# Patient Record
Sex: Male | Born: 1985 | Hispanic: Yes | Marital: Married | Smoking: Former smoker
Health system: Southern US, Community
[De-identification: ages and names within clinical notes are randomized; demographics above are authoritative.]

## PROBLEM LIST (undated history)

## (undated) DIAGNOSIS — J45909 Unspecified asthma, uncomplicated: Secondary | ICD-10-CM

## (undated) DIAGNOSIS — F419 Anxiety disorder, unspecified: Secondary | ICD-10-CM

## (undated) DIAGNOSIS — F1020 Alcohol dependence, uncomplicated: Secondary | ICD-10-CM

## (undated) DIAGNOSIS — I219 Acute myocardial infarction, unspecified: Secondary | ICD-10-CM

## (undated) HISTORY — DX: Anxiety disorder, unspecified: F41.9

## (undated) HISTORY — DX: Acute myocardial infarction, unspecified: I21.9

---

## 2003-06-07 ENCOUNTER — Emergency Department (HOSPITAL_COMMUNITY): Admission: EM | Admit: 2003-06-07 | Discharge: 2003-06-08 | Payer: Self-pay | Admitting: Emergency Medicine

## 2004-09-09 ENCOUNTER — Emergency Department (HOSPITAL_COMMUNITY): Admission: EM | Admit: 2004-09-09 | Discharge: 2004-09-09 | Payer: Self-pay | Admitting: Emergency Medicine

## 2004-12-13 ENCOUNTER — Emergency Department (HOSPITAL_COMMUNITY): Admission: EM | Admit: 2004-12-13 | Discharge: 2004-12-14 | Payer: Self-pay | Admitting: Emergency Medicine

## 2005-02-04 ENCOUNTER — Emergency Department (HOSPITAL_COMMUNITY): Admission: EM | Admit: 2005-02-04 | Discharge: 2005-02-05 | Payer: Self-pay | Admitting: Emergency Medicine

## 2008-11-24 ENCOUNTER — Emergency Department (HOSPITAL_COMMUNITY): Admission: EM | Admit: 2008-11-24 | Discharge: 2008-11-24 | Payer: Self-pay | Admitting: Emergency Medicine

## 2008-12-01 ENCOUNTER — Emergency Department (HOSPITAL_COMMUNITY): Admission: EM | Admit: 2008-12-01 | Discharge: 2008-12-01 | Payer: Self-pay | Admitting: Emergency Medicine

## 2010-04-23 ENCOUNTER — Emergency Department (HOSPITAL_COMMUNITY)
Admission: EM | Admit: 2010-04-23 | Discharge: 2010-04-23 | Disposition: A | Discharge status: Home / Self Care | Payer: Self-pay | Attending: Emergency Medicine | Admitting: Emergency Medicine

## 2010-04-23 DIAGNOSIS — J020 Streptococcal pharyngitis: Secondary | ICD-10-CM | POA: Insufficient documentation

## 2010-04-23 DIAGNOSIS — R509 Fever, unspecified: Secondary | ICD-10-CM | POA: Insufficient documentation

## 2010-04-23 DIAGNOSIS — J45909 Unspecified asthma, uncomplicated: Secondary | ICD-10-CM | POA: Insufficient documentation

## 2011-03-25 ENCOUNTER — Encounter (HOSPITAL_COMMUNITY): Payer: Self-pay | Admitting: *Deleted

## 2011-03-25 ENCOUNTER — Emergency Department (HOSPITAL_COMMUNITY)
Admission: EM | Admit: 2011-03-25 | Discharge: 2011-03-25 | Disposition: A | Discharge status: Home Health | Payer: Self-pay | Attending: Emergency Medicine | Admitting: Emergency Medicine

## 2011-03-25 ENCOUNTER — Emergency Department (HOSPITAL_COMMUNITY): Payer: Self-pay

## 2011-03-25 ENCOUNTER — Other Ambulatory Visit: Payer: Self-pay

## 2011-03-25 DIAGNOSIS — J40 Bronchitis, not specified as acute or chronic: Secondary | ICD-10-CM | POA: Insufficient documentation

## 2011-03-25 DIAGNOSIS — F410 Panic disorder [episodic paroxysmal anxiety] without agoraphobia: Secondary | ICD-10-CM | POA: Insufficient documentation

## 2011-03-25 DIAGNOSIS — F1010 Alcohol abuse, uncomplicated: Secondary | ICD-10-CM | POA: Insufficient documentation

## 2011-03-25 HISTORY — DX: Alcohol dependence, uncomplicated: F10.20

## 2011-03-25 MED ORDER — BENZONATATE 100 MG PO CAPS: 100.0000 mg | ORAL_CAPSULE | Freq: Three times a day (TID) | ORAL | Status: AC

## 2011-03-25 MED ORDER — ONDANSETRON HCL 4 MG/2ML IJ SOLN
INTRAMUSCULAR | Status: AC
  Filled 2011-03-25: qty 2

## 2011-03-25 MED ORDER — AZITHROMYCIN 250 MG PO TABS: 250.0000 mg | ORAL_TABLET | Freq: Every day | ORAL | Status: AC

## 2011-03-25 NOTE — ED Notes (Signed)
Patient returned from X-ray 

## 2011-03-25 NOTE — Discharge Instructions (Signed)
Bronchitis Bronchitis is the body's way of reacting to injury and/or infection (inflammation) of the bronchi. Bronchi are the air tubes that extend from the windpipe into the lungs. If the inflammation becomes severe, it may cause shortness of breath. CAUSES  Inflammation may be caused by:  A virus.   Germs (bacteria).   Dust.   Allergens.   Pollutants and many other irritants.  The cells lining the bronchial tree are covered with tiny hairs (cilia). These constantly beat upward, away from the lungs, toward the mouth. This keeps the lungs free of pollutants. When these cells become too irritated and are unable to do their job, mucus begins to develop. This causes the characteristic cough of bronchitis. The cough clears the lungs when the cilia are unable to do their job. Without either of these protective mechanisms, the mucus would settle in the lungs. Then you would develop pneumonia. Smoking is a common cause of bronchitis and can contribute to pneumonia. Stopping this habit is the single most important thing you can do to help yourself. TREATMENT   Your caregiver may prescribe an antibiotic if the cough is caused by bacteria. Also, medicines that open up your airways make it easier to breathe. Your caregiver may also recommend or prescribe an expectorant. It will loosen the mucus to be coughed up. Only take over-the-counter or prescription medicines for pain, discomfort, or fever as directed by your caregiver.   Removing whatever causes the problem (smoking, for example) is critical to preventing the problem from getting worse.   Cough suppressants may be prescribed for relief of cough symptoms.   Inhaled medicines may be prescribed to help with symptoms now and to help prevent problems from returning.   For those with recurrent (chronic) bronchitis, there may be a need for steroid medicines.  SEEK IMMEDIATE MEDICAL CARE IF:   During treatment, you develop more pus-like mucus  (purulent sputum).   You have a fever.   Your baby is older than 3 months with a rectal temperature of 102 F (38.9 C) or higher.   Your baby is 72 months old or younger with a rectal temperature of 100.4 F (38 C) or higher.   You become progressively more ill.   You have increased difficulty breathing, wheezing, or shortness of breath.  It is necessary to seek immediate medical care if you are elderly or sick from any other disease. MAKE SURE YOU:   Understand these instructions.   Will watch your condition.   Will get help right away if you are not doing well or get worse.  Document Released: 01/16/2005 Document Revised: 09/28/2010 Document Reviewed: 11/26/2007 ExitCare Patient Information 2012 ExitCare, Deatra Canter and Panic Attacks Anxiety is your body's way of reacting to real danger or something you think is a danger. It may be fear or worry over a situation like losing your job. Sometimes the cause is not known. A panic attack is made up of physical signs like sweating, shaking, or chest pain. Anxiety and panic attacks may start suddenly. They may be strong. They may come at any time of day, even while sleeping. They may come at any time of life. Panic attacks are scary, but they do not harm you physically.  HOME CARE  Avoid any known causes of your anxiety.   Try to relax. Yoga may help. Tell yourself everything will be okay.   Exercise often.   Get expert advice and help (therapy) to stop anxiety or attacks from happening.   Avoid  caffeine, alcohol, and drugs.   Only take medicine as told by your doctor.  GET HELP RIGHT AWAY IF:  Your attacks seem different than normal attacks.   Your problems are getting worse or concern you.  MAKE SURE YOU:  Understand these instructions.   Will watch your condition.   Will get help right away if you are not doing well or get worse.  Document Released: 02/18/2010 Document Revised: 09/28/2010 Document Reviewed:  02/18/2010 [facility] Patient Information 2012 [place], [place].Marland Kitchen

## 2011-03-25 NOTE — ED Notes (Signed)
ZOX:[id number]<AV> Expected date:03/25/11<BR> Expected time: 3:54 AM<BR> Means of arrival:Ambulance<BR> Comments:<BR> etoh

## 2011-03-25 NOTE — ED Provider Notes (Addendum)
History     CSN: [id number]  Arrival date & time 03/25/11  4098   First MD Initiated Contact with Patient 03/25/11 0425      Chief Complaint  Patient presents with  . Alcohol Problem    (Consider location/radiation/quality/duration/timing/severity/associated sxs/prior treatment) HPI Comments: Patient presents with possible anxiety or panic attack. He states she's had a history of anxiety and panic attacks in the past and today he was seen at his computer and felt suddenly very anxious and had the feeling he was given a diet. He did have some shortness of breath associated with it. Denies any chest pain other than he has a little pain in the center of his chest from coughing. He has had a productive cough for about 2 weeks now. Denies any fevers or chills. He's had a little bit of posttussive emesis but no other vomiting or diarrhea. No abdominal pain. Denies any headache. No numbness or weakness in his extremities. No balance problems. He does admit to drinking alcohol today but denies any drug use  Patient is a 26 y.o. male presenting with alcohol problem. The history is provided by the patient.  Alcohol Problem Pertinent negatives include no chest pain, no abdominal pain, no headaches and no shortness of breath.    Past Medical History  Diagnosis Date  . Alcoholic     History reviewed. No pertinent past surgical history.  History reviewed. No pertinent family history.  History  Substance Use Topics  . Smoking status: Not on file  . Smokeless tobacco: Not on file  . Alcohol Use:      usually 4 bottles every weekend      Review of Systems  Constitutional: Negative for fever, chills, diaphoresis and fatigue.  HENT: Positive for congestion. Negative for rhinorrhea and sneezing.   Eyes: Negative.   Respiratory: Positive for cough. Negative for chest tightness and shortness of breath.   Cardiovascular: Negative for chest pain and leg swelling.  Gastrointestinal: Negative  for nausea, vomiting, abdominal pain, diarrhea and blood in stool.  Genitourinary: Negative for frequency, hematuria, flank pain and difficulty urinating.  Musculoskeletal: Negative for back pain and arthralgias.  Skin: Negative for rash.  Neurological: Negative for dizziness, speech difficulty, weakness, numbness and headaches.    Allergies  Review of patient's allergies indicates no known allergies.  Home Medications   Current Outpatient Rx  Name Route Sig Dispense Refill  . AZITHROMYCIN 250 MG PO TABS Oral Take 1 tablet (250 mg total) by mouth daily. Take first 2 tablets together, then 1 every day until finished. 6 tablet 0  . BENZONATATE 100 MG PO CAPS Oral Take 1 capsule (100 mg total) by mouth every 8 (eight) hours. 21 capsule 0    BP 125/77  Pulse 92  Temp(Src) 98.6 F (37 C) (Oral)  Resp 18  SpO2 98%  Physical Exam  Constitutional: He is oriented to person, place, and time. He appears well-developed and well-nourished.  HENT:  Head: Normocephalic and atraumatic.  Eyes: Pupils are equal, round, and reactive to light.  Neck: Normal range of motion. Neck supple.  Cardiovascular: Normal rate, regular rhythm and normal heart sounds.   Pulmonary/Chest: Effort normal and breath sounds normal. No respiratory distress. He has no wheezes. He has no rales. He exhibits no tenderness.  Abdominal: Soft. Bowel sounds are normal. There is no tenderness. There is no rebound and no guarding.  Musculoskeletal: Normal range of motion. He exhibits no edema.  Lymphadenopathy:    He has no  cervical adenopathy.  Neurological: He is alert and oriented to person, place, and time.  Skin: Skin is warm and dry. No rash noted.  Psychiatric: He has a normal mood and affect.    ED Course  Procedures (including critical care time)   Date: 03/25/2011  Rate: 87  Rhythm: normal sinus rhythm  QRS Axis: normal  Intervals: normal  ST/T Wave abnormalities: nonspecific ST/T changes  Conduction  Disutrbances:none  Narrative Interpretation:   Old EKG Reviewed: unchanged   Dg Chest 2 View  03/25/2011  *RADIOLOGY REPORT*  Clinical Data: Cough, shortness of breath.  CHEST - 2 VIEW  Comparison: None  Findings: Peribronchial thickening. Heart and mediastinal contours are within normal limits.  No focal opacities or effusions.  No acute bony abnormality.  IMPRESSION: Bronchitic changes.  Original Report Authenticated By: Cyndie Chime, M.D.      1. Bronchitis   2. Panic attack       MDM  Patient is asymptomatic now other than his cough. There is nothing to suggest acute coronary syndrome. Neuro deficits. Suspect that this was a panic attack.  Since cough has been going on for 2 weeks, will tx with abx, cough meds.  Pt interested in following up with someone for his anxiety attacks.  Will give list of mental health providers to call and make an appointment with.       Rolan Bucco, MD 03/25/11 7829  Rolan Bucco, MD 03/25/11 [phone]

## 2011-08-03 ENCOUNTER — Encounter (HOSPITAL_COMMUNITY): Payer: Self-pay | Admitting: Emergency Medicine

## 2011-08-03 ENCOUNTER — Emergency Department (HOSPITAL_COMMUNITY)
Admission: EM | Admit: 2011-08-03 | Discharge: 2011-08-03 | Disposition: A | Discharge status: Home / Self Care | Payer: Self-pay | Attending: Emergency Medicine | Admitting: Emergency Medicine

## 2011-08-03 ENCOUNTER — Emergency Department (HOSPITAL_COMMUNITY): Payer: Self-pay

## 2011-08-03 DIAGNOSIS — F411 Generalized anxiety disorder: Secondary | ICD-10-CM | POA: Insufficient documentation

## 2011-08-03 DIAGNOSIS — R0602 Shortness of breath: Secondary | ICD-10-CM | POA: Insufficient documentation

## 2011-08-03 DIAGNOSIS — Z87891 Personal history of nicotine dependence: Secondary | ICD-10-CM | POA: Insufficient documentation

## 2011-08-03 DIAGNOSIS — F419 Anxiety disorder, unspecified: Secondary | ICD-10-CM

## 2011-08-03 HISTORY — DX: Unspecified asthma, uncomplicated: J45.909

## 2011-08-03 LAB — CBC
HCT: 45.1 % (ref 39.0–52.0)
Hemoglobin: 15.4 g/dL (ref 13.0–17.0)
MCH: 31.4 pg (ref 26.0–34.0)
MCHC: 34.1 g/dL (ref 30.0–36.0)
MCV: 92 fL (ref 78.0–100.0)
Platelets: 252 10*3/uL (ref 150–400)
RBC: 4.9 MIL/uL (ref 4.22–5.81)

## 2011-08-03 LAB — POCT I-STAT, CHEM 8
BUN: 16 mg/dL (ref 6–23)
Calcium, Ion: 1.24 mmol/L — ABNORMAL HIGH (ref 1.12–1.23)
Chloride: 101 mEq/L (ref 96–112)
Glucose, Bld: 100 mg/dL — ABNORMAL HIGH (ref 70–99)
HCT: 48 % (ref 39.0–52.0)
Potassium: 4.5 mEq/L (ref 3.5–5.1)
TCO2: 26 mmol/L (ref 0–100)

## 2011-08-03 MED ORDER — LORAZEPAM 1 MG PO TABS
1.0000 mg | ORAL_TABLET | Freq: Once | ORAL | Status: DC
  Filled 2011-08-03: qty 1

## 2011-08-03 MED ORDER — ALPRAZOLAM 0.5 MG PO TABS: 0.5000 mg | ORAL_TABLET | Freq: Three times a day (TID) | ORAL | Status: AC | PRN

## 2011-08-03 NOTE — ED Notes (Signed)
MD at bedside. 

## 2011-08-03 NOTE — ED Provider Notes (Signed)
History     CSN: [id number]  Arrival date & time 08/03/11  0618   First MD Initiated Contact with Patient 08/03/11 [phone]      Chief Complaint  Patient presents with  . Anxiety  . Shortness of Breath    HPI Pt states for the last few weeks he has been having trouble with feeling short of breath at times.  He gets anxious associated with this.  No chest pain or leg swelling.  No wheezing or cough.  He has some nasal congestion but that is new. He will feel like his throat is closing up when this occurs sometimes too.  He has had some incidental headaches behind his eye.  This AM he had another episode and it was worse so he came to be evaluation. No recent trips or travel.  No history of PE or CAD.  No family history of CAD.    Pt denies depression.  Prior history of alcohol use but denies current use. Past Medical History  Diagnosis Date  . Alcoholic   . Asthma     History reviewed. No pertinent past surgical history.  Family History  Problem Relation Age of Onset  . Diabetes Other     History  Substance Use Topics  . Smoking status: Former Games developer  . Smokeless tobacco: Not on file  . Alcohol Use: No     former      Review of Systems  All other systems reviewed and are negative.    Allergies  Review of patient's allergies indicates no known allergies.  Home Medications  No current outpatient prescriptions on file.  BP 116/73  Pulse 60  Temp 97.7 F (36.5 C) (Oral)  Resp 20  SpO2 100%  Physical Exam  Nursing note and vitals reviewed. Constitutional: He appears well-developed and well-nourished. No distress.  HENT:  Head: Normocephalic and atraumatic.  Right Ear: External ear normal.  Left Ear: External ear normal.  Eyes: Conjunctivae are normal. Right eye exhibits no discharge. Left eye exhibits no discharge. No scleral icterus.  Neck: Neck supple. No tracheal deviation present. No thyromegaly present.  Cardiovascular: Normal rate, regular rhythm and  intact distal pulses.   Pulmonary/Chest: Effort normal and breath sounds normal. No stridor. No respiratory distress. He has no wheezes. He has no rales.  Abdominal: Soft. Bowel sounds are normal. He exhibits no distension. There is no tenderness. There is no rebound and no guarding.  Musculoskeletal: He exhibits no edema and no tenderness.  Lymphadenopathy:    He has no cervical adenopathy.  Neurological: He is alert. He has normal strength. No sensory deficit. Cranial nerve deficit:  no gross defecits noted. He exhibits normal muscle tone. He displays no seizure activity. Coordination normal.  Skin: Skin is warm and dry. No rash noted.  Psychiatric: He has a normal mood and affect. His behavior is normal. Judgment and thought content normal.    ED Course  Procedures (including critical care time)  Labs Reviewed  POCT I-STAT, CHEM 8 - Abnormal; Notable for the following:    Glucose, Bld 100 (*)     Calcium, Ion 1.24 (*)     All other components within normal limits  CBC   Dg Chest 2 View  08/03/2011  *RADIOLOGY REPORT*  Clinical Data: Mid chest pain, shortness of breath, cough and congestion  CHEST - 2 VIEW  Comparison: Chest x-ray of 03/25/2011  Findings: No active infiltrate or effusion is seen.  As noted previously there is some peribronchial  thickening which may indicate bronchitis.  Mediastinal contours are stable.  The heart is within normal limits in size.  No bony abnormality is seen.  IMPRESSION:   No active lung disease.  No change in peribronchial thickening which may indicate bronchitis.  Original Report Authenticated By: Juline Patch, M.D.      MDM  No evidence of PNA.  Low risk for PE.  Perc negative.    Suspect symptoms are related to anxiety.  Will rx short course of benzo.   Recc follow up with a PCP to establish long care treatment.        Celene Kras, MD 08/03/11 [phone]

## 2011-08-03 NOTE — ED Notes (Signed)
Pt states he has been feeling short of breath lately and has been also having issues with anxiety  Pt states he has pain behind his right eye and pt states when he gets too hot he feels cold and gets shaky  Pt states he also feels like sometimes his throat is closing up and it is hard for him to swallow food  Pt states also for the past couple days he has developed some cold sxs  Pt states this morning he just felt more short of breath than usual and it scared him

## 2011-08-03 NOTE — ED Notes (Signed)
Pt reports difficulty breathing and feelings of anxiety that have been ongoing for about 2 weeks.  States that onset is random and that at times, he will feel cold and shaky when his anxiety is really bad.  Pt does also report headaches and a feeling of pressure behind his eyes but does not currently report any pain. No distress is noted at this time.

## 2011-08-03 NOTE — ED Notes (Signed)
Pt resting with family appears to be in no acute distress. Denies any pain, nausea/vomiting. States breathing is "fine at the moment".

## 2011-11-19 ENCOUNTER — Encounter (HOSPITAL_COMMUNITY): Payer: Self-pay | Admitting: Emergency Medicine

## 2011-11-19 ENCOUNTER — Emergency Department (HOSPITAL_COMMUNITY)
Admission: EM | Admit: 2011-11-19 | Discharge: 2011-11-19 | Disposition: A | Discharge status: Home / Self Care | Payer: Self-pay | Attending: Emergency Medicine | Admitting: Emergency Medicine

## 2011-11-19 DIAGNOSIS — Z87891 Personal history of nicotine dependence: Secondary | ICD-10-CM | POA: Insufficient documentation

## 2011-11-19 DIAGNOSIS — J329 Chronic sinusitis, unspecified: Secondary | ICD-10-CM | POA: Insufficient documentation

## 2011-11-19 MED ORDER — AMOXICILLIN 500 MG PO CAPS: 500.0000 mg | ORAL_CAPSULE | Freq: Three times a day (TID) | ORAL | Status: DC

## 2011-11-19 MED ORDER — GUAIFENESIN ER 600 MG PO TB12: 1200.0000 mg | ORAL_TABLET | Freq: Two times a day (BID) | ORAL | Status: DC

## 2011-11-19 NOTE — ED Provider Notes (Signed)
History     CSN: [id number]  Arrival date & time 11/19/11  1478   First MD Initiated Contact with Patient 11/19/11 0441      Chief Complaint  Patient presents with  . Cough   HPI  Provided by the patient. Patient is a 26 year old male with no significant PMH who presents with complaints of persistent cough, nasal congestion and sore throat. Patient reports having waxing waning symptoms for the past one month. Over the past one to 2 days symptoms have become a worse with increased sore throat and sinus pressure. Patient denies having any fever, chills or sweats. Cough is productive of yellow and green phlegm. He denies any hemoptysis. Denies any nausea, vomiting or diarrhea. Patient used one dose of Alka-Seltzer cough and cold yesterday without any significant change in symptoms. He has not used any other treatments.    Past Medical History  Diagnosis Date  . Alcoholic   . Asthma     History reviewed. No pertinent past surgical history.  Family History  Problem Relation Age of Onset  . Diabetes Other     History  Substance Use Topics  . Smoking status: Former Games developer  . Smokeless tobacco: Not on file  . Alcohol Use: Yes     occasional      Review of Systems  Constitutional: Negative for fever and chills.  HENT: Positive for congestion, sore throat and sinus pressure.   Respiratory: Positive for cough. Negative for shortness of breath.   Cardiovascular: Negative for chest pain.  Gastrointestinal: Negative for nausea, vomiting and diarrhea.  Skin: Negative for rash.    Allergies  Review of patient's allergies indicates no known allergies.  Home Medications   Current Outpatient Rx  Name Route Sig Dispense Refill  . ASPIRIN EFFERVESCENT 325 MG PO TBEF Oral Take 325 mg by mouth once.      BP 133/79  Pulse 92  Temp 98.8 F (37.1 C) (Oral)  Resp 20  Ht 5\' 8"  (1.727 m)  Wt 185 lb (83.915 kg)  BMI 28.13 kg/m2  SpO2 98%  Physical Exam  Nursing note and  vitals reviewed. Constitutional: He is oriented to person, place, and time. He appears well-developed and well-nourished. No distress.  HENT:  Head: Normocephalic.  Right Ear: Tympanic membrane normal.  Left Ear: Tympanic membrane normal.  Nose: Right sinus exhibits frontal sinus tenderness. Left sinus exhibits frontal sinus tenderness.       Pharynx is erythematous without enlarged tonsils or exudate.   Eyes: Conjunctivae normal and EOM are normal. Pupils are equal, round, and reactive to light.  Neck: Normal range of motion. Neck supple.       No meningeal signs.  Cardiovascular: Normal rate and regular rhythm.   No murmur heard. Pulmonary/Chest: Effort normal and breath sounds normal. No respiratory distress. He has no wheezes. He has no rales.  Lymphadenopathy:    He has no cervical adenopathy.  Neurological: He is alert and oriented to person, place, and time.  Skin: Skin is warm. No rash noted.  Psychiatric: He has a normal mood and affect. His behavior is normal.    ED Course  Procedures      1. Sinusitis       MDM  4:50AM patient seen and evaluated. Patient with symptoms waxing and waning for the past month. Now having significant maxillary sinus pressure, yellow and green sputum with cough. Concern for sinusitis. Will give prescription of amoxicillin and Mucinex.  Angus Seller, [place] 11/19/11 [phone]

## 2011-11-19 NOTE — ED Provider Notes (Signed)
Medical screening examination/treatment/procedure(s) were performed by non-physician practitioner and as supervising physician I was immediately available for consultation/collaboration.   Shantel Wesely M Malaya Cagley, MD 11/19/11 0540 

## 2011-11-19 NOTE — ED Notes (Signed)
Pt c/o productive cough with green sputum intermittent x 1 month, also c/o sore throat, nasal congestion and facial pressure. Pt states the children in home have been sick as well. PWD, no distress noted at this time.

## 2011-11-19 NOTE — ED Notes (Addendum)
Pt c/o cough for over as month. Pt has felt warm for last few days.pt has had thick green sputum.VSS.

## 2012-03-19 ENCOUNTER — Encounter (HOSPITAL_COMMUNITY): Payer: Self-pay | Admitting: *Deleted

## 2012-03-19 ENCOUNTER — Emergency Department (HOSPITAL_COMMUNITY)
Admission: EM | Admit: 2012-03-19 | Discharge: 2012-03-20 | Disposition: A | Discharge status: Home / Self Care | Payer: Self-pay | Attending: Emergency Medicine | Admitting: Emergency Medicine

## 2012-03-19 DIAGNOSIS — R109 Unspecified abdominal pain: Secondary | ICD-10-CM | POA: Insufficient documentation

## 2012-03-19 DIAGNOSIS — R110 Nausea: Secondary | ICD-10-CM | POA: Insufficient documentation

## 2012-03-19 DIAGNOSIS — R630 Anorexia: Secondary | ICD-10-CM | POA: Insufficient documentation

## 2012-03-19 DIAGNOSIS — Z87891 Personal history of nicotine dependence: Secondary | ICD-10-CM | POA: Insufficient documentation

## 2012-03-19 DIAGNOSIS — R142 Eructation: Secondary | ICD-10-CM | POA: Insufficient documentation

## 2012-03-19 DIAGNOSIS — R141 Gas pain: Secondary | ICD-10-CM | POA: Insufficient documentation

## 2012-03-19 LAB — URINALYSIS, ROUTINE W REFLEX MICROSCOPIC
Bilirubin Urine: NEGATIVE
Glucose, UA: NEGATIVE mg/dL
Hgb urine dipstick: NEGATIVE
Ketones, ur: NEGATIVE mg/dL
Leukocytes, UA: NEGATIVE
Nitrite: NEGATIVE
Protein, ur: NEGATIVE mg/dL
Urobilinogen, UA: 1 mg/dL (ref 0.0–1.0)
pH: 7 (ref 5.0–8.0)

## 2012-03-19 MED ORDER — IBUPROFEN 800 MG PO TABS: 800.0000 mg | ORAL_TABLET | Freq: Once | ORAL | Status: DC

## 2012-03-19 MED ORDER — ONDANSETRON HCL 4 MG PO TABS: 8.0000 mg | ORAL_TABLET | Freq: Once | ORAL | Status: DC

## 2012-03-19 NOTE — ED Notes (Signed)
Pt reports rt flank pain x1 month that spontaneously resolved on it's own - pt now experiencing generalized abd cramping and some continued rt flank pain x2 weeks. Pt denies n/v/d or fever. Pt also denies any dysuria. Pt in no acute distress at present.

## 2012-03-19 NOTE — ED Provider Notes (Signed)
History    This chart was scribed for Iona Coach, a non-physician practitioner working with Toy Baker, MD by Lewanda Rife, ED Scribe. This patient was seen in room WTR7/WTR7 and the patient's care was started at 2340.     CSN: [id number]  Arrival date & time 03/19/12  2250   First MD Initiated Contact with Patient 03/19/12 2322      Chief Complaint  Patient presents with  . Abdominal Cramping    (Consider location/radiation/quality/duration/timing/severity/associated sxs/prior treatment) HPI Alexander Spears is a 27 y.o. male who presents to the Emergency Department complaining of waxing and waning diffuse and always changing locations, abdominal pain onset 1 month since changing diet to eating mostly salads everyday. Pt reports mild loss of appetite, feeling bloated, and nausea. Pt denies dysuria, constipation, emesis, hematochezia, and melena. Pt reports abdominal pain is aching at rest and aggravated with movement. Pt denies taking any medications at home to treat symptoms. Pt reports drinking alcohol occasionally. Pt states just changed his diet about a month ago, and now tries to eat more salads and vegetables. States stools are harder than usual. Last BM today.    Past Medical History  Diagnosis Date  . Alcoholic   . Asthma     History reviewed. No pertinent past surgical history.  Family History  Problem Relation Age of Onset  . Diabetes Other     History  Substance Use Topics  . Smoking status: Former Games developer  . Smokeless tobacco: Not on file  . Alcohol Use: Yes     Comment: occasional      Review of Systems  All other systems reviewed and are negative.   A complete 10 system review of systems was obtained and all systems are negative except as noted in the HPI and PMH.    Allergies  Review of patient's allergies indicates no known allergies.  Home Medications   Current Outpatient Rx  Name  Route  Sig  Dispense  Refill  .  amoxicillin (AMOXIL) 500 MG capsule   Oral   Take 1 capsule (500 mg total) by mouth 3 (three) times daily.   30 capsule   0   . aspirin-sod bicarb-citric acid (ALKA-SELTZER) 325 MG TBEF   Oral   Take 325 mg by mouth once.         Marland Kitchen guaiFENesin (MUCINEX) 600 MG 12 hr tablet   Oral   Take 2 tablets (1,200 mg total) by mouth 2 (two) times daily.   30 tablet   0     BP 133/66  Pulse 74  Temp(Src) 98.5 F (36.9 C) (Oral)  SpO2 97%  Physical Exam  Nursing note and vitals reviewed. Constitutional: He is oriented to person, place, and time. He appears well-developed and well-nourished. No distress.  HENT:  Head: Normocephalic and atraumatic.  Eyes: EOM are normal.  Neck: Neck supple. No tracheal deviation present.  Cardiovascular: Normal rate.   Pulmonary/Chest: Effort normal. No respiratory distress.  Abdominal: Soft. Bowel sounds are normal. There is tenderness in the right lower quadrant and left lower quadrant. There is no guarding and no CVA tenderness.  Musculoskeletal: Normal range of motion.  Neurological: He is alert and oriented to person, place, and time.  Skin: Skin is warm and dry.  Psychiatric: He has a normal mood and affect. His behavior is normal.    ED Course  Procedures (including critical care time) Medications - No data to display  Results for orders placed during the hospital  encounter of 03/19/12  URINALYSIS, ROUTINE W REFLEX MICROSCOPIC      Result Value Range   Color, Urine YELLOW  YELLOW   APPearance CLOUDY (*) CLEAR   Specific Gravity, Urine 1.030  1.005 - 1.030   pH 7.0  5.0 - 8.0   Glucose, UA NEGATIVE  NEGATIVE mg/dL   Hgb urine dipstick NEGATIVE  NEGATIVE   Bilirubin Urine NEGATIVE  NEGATIVE   Ketones, ur NEGATIVE  NEGATIVE mg/dL   Protein, ur NEGATIVE  NEGATIVE mg/dL   Urobilinogen, UA 1.0  0.0 - 1.0 mg/dL   Nitrite NEGATIVE  NEGATIVE   Leukocytes, UA NEGATIVE  NEGATIVE  CBC WITH DIFFERENTIAL      Result Value Range   WBC 10.1   4.0 - 10.5 K/uL   RBC 4.66  4.22 - 5.81 MIL/uL   Hemoglobin 14.5  13.0 - 17.0 g/dL   HCT 16.1  09.6 - 04.5 %   MCV 90.8  78.0 - 100.0 fL   MCH 31.1  26.0 - 34.0 pg   MCHC 34.3  30.0 - 36.0 g/dL   RDW 40.9  81.1 - 91.4 %   Platelets 259  150 - 400 K/uL   Neutrophils Relative 50  43 - 77 %   Lymphocytes Relative 35  12 - 46 %   Monocytes Relative 13 (*) 3 - 12 %   Eosinophils Relative 2  0 - 5 %   Basophils Relative 0  0 - 1 %   Neutro Abs 5.1  1.7 - 7.7 K/uL   Lymphs Abs 3.5  0.7 - 4.0 K/uL   Monocytes Absolute 1.3 (*) 0.1 - 1.0 K/uL   Eosinophils Absolute 0.2  0.0 - 0.7 K/uL   Basophils Absolute 0.0  0.0 - 0.1 K/uL   WBC Morphology ATYPICAL LYMPHOCYTES    COMPREHENSIVE METABOLIC PANEL      Result Value Range   Sodium 138  135 - 145 mEq/L   Potassium 3.9  3.5 - 5.1 mEq/L   Chloride 101  96 - 112 mEq/L   CO2 29  19 - 32 mEq/L   Glucose, Bld 88  70 - 99 mg/dL   BUN 18  6 - 23 mg/dL   Creatinine, Ser 7.82  0.50 - 1.35 mg/dL   Calcium 9.0  8.4 - 95.6 mg/dL   Total Protein 7.8  6.0 - 8.3 g/dL   Albumin 3.8  3.5 - 5.2 g/dL   AST 16  0 - 37 U/L   ALT 16  0 - 53 U/L   Alkaline Phosphatase 70  39 - 117 U/L   Total Bilirubin 0.2 (*) 0.3 - 1.2 mg/dL   GFR calc non Af Amer >90  >90 mL/min   GFR calc Af Amer >90  >90 mL/min  LIPASE, BLOOD      Result Value Range   Lipase 25  11 - 59 U/L   Dg Abd 2 Views  03/20/2012  *RADIOLOGY REPORT*  Clinical Data: Abdominal distension, pain  ABDOMEN - 2 VIEW  Comparison: None.  Findings: Lung bases are clear.  Nonobstructed bowel gas pattern. Unremarkable colonic stool burden.  No organomegaly.  No radiopaque calculi identified.  No free air or air-fluid levels.  Osseous structures are intact and unremarkable.  IMPRESSION: Negative abdominal radiograph.   Original Report Authenticated By: Malachy Moan, M.D.       1. Abdominal  pain, other specified site       MDM  Pt with diffuse abdominal pain that comes  and goes. No associated  symptoms. Pain for several weeks. Recently changed his diet, now eats more salads, some days, salad is the only thing he eats. He is non toxic appearing on exam. No guarding on abdominal exam. Abdomen soft. Doubt surgical abdomen. His labs are normal. X-ray reviewed by me, there is some stool in his colon. Will try stool softners, pt does states his bowels changed from soft, to small hard pebbles. Will follow up outpatient for further evaluation and treatment.   Filed Vitals:   03/19/12 2316 03/20/12 0144  BP: 133/66 97/78  Pulse: 74 62  Temp: 98.5 F (36.9 C) 98.3 F (36.8 C)  TempSrc: Oral Oral  Resp:  16  SpO2: 97% 98%     I personally performed the services described in this documentation, which was scribed in my presence. The recorded information has been reviewed and is accurate.   Lottie Mussel, PA 03/20/12 [phone]

## 2012-03-20 ENCOUNTER — Emergency Department (HOSPITAL_COMMUNITY): Payer: Self-pay

## 2012-03-20 LAB — COMPREHENSIVE METABOLIC PANEL
AST: 16 U/L (ref 0–37)
Albumin: 3.8 g/dL (ref 3.5–5.2)
Alkaline Phosphatase: 70 U/L (ref 39–117)
BUN: 18 mg/dL (ref 6–23)
CO2: 29 mEq/L (ref 19–32)
Chloride: 101 mEq/L (ref 96–112)
Creatinine, Ser: 1.04 mg/dL (ref 0.50–1.35)
GFR calc Af Amer: >90 mL/min (ref >90)
GFR calc non Af Amer: >90 mL/min (ref >90)
Glucose, Bld: 88 mg/dL (ref 70–99)
Potassium: 3.9 mEq/L (ref 3.5–5.1)
Sodium: 138 mEq/L (ref 135–145)
Total Bilirubin: 0.2 mg/dL — ABNORMAL LOW (ref 0.3–1.2)
Total Protein: 7.8 g/dL (ref 6.0–8.3)

## 2012-03-20 LAB — CBC WITH DIFFERENTIAL/PLATELET
Basophils Absolute: 0 10*3/uL (ref 0.0–0.1)
Basophils Relative: 0 % (ref 0–1)
Eosinophils Absolute: 0.2 10*3/uL (ref 0.0–0.7)
Eosinophils Relative: 2 % (ref 0–5)
HCT: 42.3 % (ref 39.0–52.0)
Hemoglobin: 14.5 g/dL (ref 13.0–17.0)
Lymphocytes Relative: 35 % (ref 12–46)
Lymphs Abs: 3.5 10*3/uL (ref 0.7–4.0)
MCH: 31.1 pg (ref 26.0–34.0)
MCHC: 34.3 g/dL (ref 30.0–36.0)
MCV: 90.8 fL (ref 78.0–100.0)
Monocytes Absolute: 1.3 10*3/uL — ABNORMAL HIGH (ref 0.1–1.0)
Monocytes Relative: 13 % — ABNORMAL HIGH (ref 3–12)
Neutro Abs: 5.1 10*3/uL (ref 1.7–7.7)
Neutrophils Relative %: 50 % (ref 43–77)
Platelets: 259 10*3/uL (ref 150–400)
RBC: 4.66 MIL/uL (ref 4.22–5.81)
RDW: 13.1 % (ref 11.5–15.5)
WBC: 10.1 10*3/uL (ref 4.0–10.5)

## 2012-03-20 LAB — LIPASE, BLOOD: Lipase: 25 U/L (ref 11–59)

## 2012-03-20 MED ORDER — POLYETHYLENE GLYCOL 3350 17 GM/SCOOP PO POWD: 17.0000 g | Freq: Every day | ORAL | Status: DC

## 2012-03-25 NOTE — ED Provider Notes (Signed)
Medical screening examination/treatment/procedure(s) were performed by non-physician practitioner and as supervising physician I was immediately available for consultation/collaboration.  Hurman Horn, MD 03/25/12 1900

## 2012-08-18 ENCOUNTER — Emergency Department (HOSPITAL_COMMUNITY)
Admission: EM | Admit: 2012-08-18 | Discharge: 2012-08-19 | Disposition: A | Discharge status: Home / Self Care | Payer: Self-pay | Attending: Emergency Medicine | Admitting: Emergency Medicine

## 2012-08-18 ENCOUNTER — Encounter (HOSPITAL_COMMUNITY): Payer: Self-pay | Admitting: *Deleted

## 2012-08-18 DIAGNOSIS — Z87891 Personal history of nicotine dependence: Secondary | ICD-10-CM | POA: Insufficient documentation

## 2012-08-18 DIAGNOSIS — IMO0002 Reserved for concepts with insufficient information to code with codable children: Secondary | ICD-10-CM | POA: Insufficient documentation

## 2012-08-18 DIAGNOSIS — S0990XA Unspecified injury of head, initial encounter: Secondary | ICD-10-CM | POA: Insufficient documentation

## 2012-08-18 DIAGNOSIS — J45901 Unspecified asthma with (acute) exacerbation: Secondary | ICD-10-CM | POA: Insufficient documentation

## 2012-08-18 DIAGNOSIS — J329 Chronic sinusitis, unspecified: Secondary | ICD-10-CM | POA: Insufficient documentation
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
## 2012-08-19 ENCOUNTER — Emergency Department (HOSPITAL_COMMUNITY): Payer: Self-pay
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
## 2012-08-19 NOTE — ED Notes (Signed)
Patient transported to CT 
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
## 2012-08-19 NOTE — ED Notes (Signed)
Family at bedside. 

## 2012-10-01 ENCOUNTER — Encounter (HOSPITAL_COMMUNITY): Payer: Self-pay

## 2012-10-01 ENCOUNTER — Emergency Department (HOSPITAL_COMMUNITY)
Admission: EM | Admit: 2012-10-01 | Discharge: 2012-10-01 | Disposition: A | Discharge status: Home / Self Care | Payer: Self-pay | Attending: Emergency Medicine | Admitting: Emergency Medicine

## 2012-10-01 ENCOUNTER — Emergency Department (HOSPITAL_COMMUNITY): Payer: Self-pay

## 2012-10-01 DIAGNOSIS — R1013 Epigastric pain: Secondary | ICD-10-CM | POA: Insufficient documentation

## 2012-10-01 DIAGNOSIS — J45909 Unspecified asthma, uncomplicated: Secondary | ICD-10-CM | POA: Insufficient documentation

## 2012-10-01 DIAGNOSIS — R109 Unspecified abdominal pain: Secondary | ICD-10-CM

## 2012-10-01 DIAGNOSIS — R1011 Right upper quadrant pain: Secondary | ICD-10-CM | POA: Insufficient documentation

## 2012-10-01 DIAGNOSIS — R071 Chest pain on breathing: Secondary | ICD-10-CM | POA: Insufficient documentation

## 2012-10-01 DIAGNOSIS — Z87891 Personal history of nicotine dependence: Secondary | ICD-10-CM | POA: Insufficient documentation

## 2012-10-01 DIAGNOSIS — R0789 Other chest pain: Secondary | ICD-10-CM

## 2012-10-01 LAB — CBC WITH DIFFERENTIAL/PLATELET
Basophils Relative: 0 % (ref 0–1)
Eosinophils Absolute: 0.1 10*3/uL (ref 0.0–0.7)
Hemoglobin: 14.6 g/dL (ref 13.0–17.0)
Lymphs Abs: 2.3 10*3/uL (ref 0.7–4.0)
MCH: 30.5 pg (ref 26.0–34.0)
Monocytes Relative: 11 % (ref 3–12)
Neutro Abs: 5.2 10*3/uL (ref 1.7–7.7)
Neutrophils Relative %: 61 % (ref 43–77)
Platelets: 274 10*3/uL (ref 150–400)
RBC: 4.79 MIL/uL (ref 4.22–5.81)

## 2012-10-01 LAB — URINALYSIS, ROUTINE W REFLEX MICROSCOPIC
Bilirubin Urine: NEGATIVE
Leukocytes, UA: NEGATIVE
Nitrite: NEGATIVE
Specific Gravity, Urine: 1.029 (ref 1.005–1.030)
Urobilinogen, UA: 1 mg/dL (ref 0.0–1.0)
pH: 7 (ref 5.0–8.0)

## 2012-10-01 LAB — LIPASE, BLOOD: Lipase: 16 U/L (ref 11–59)

## 2012-10-01 LAB — POCT I-STAT, CHEM 8
Chloride: 102 mEq/L (ref 96–112)
Creatinine, Ser: 1.1 mg/dL (ref 0.50–1.35)
HCT: 45 % (ref 39.0–52.0)
Hemoglobin: 15.3 g/dL (ref 13.0–17.0)
Potassium: 3.9 mEq/L (ref 3.5–5.1)
Sodium: 140 mEq/L (ref 135–145)

## 2012-10-01 LAB — HEPATIC FUNCTION PANEL
Albumin: 4.1 g/dL (ref 3.5–5.2)
Total Protein: 7.4 g/dL (ref 6.0–8.3)

## 2012-10-01 LAB — POCT I-STAT TROPONIN I

## 2012-10-01 MED ORDER — IBUPROFEN 600 MG PO TABS: 600.0000 mg | ORAL_TABLET | Freq: Four times a day (QID) | ORAL | Status: DC | PRN

## 2012-10-01 NOTE — ED Notes (Signed)
Pt c/o lt upper chest/shoulder/arm pain x2days and c/o rt upper rib pain "feels like something is out of place"; denies n/v/SOB or injury

## 2012-10-01 NOTE — ED Provider Notes (Signed)
Medical screening examination/treatment/procedure(s) were performed by non-physician practitioner and as supervising physician I was immediately available for consultation/collaboration. Devoria Albe, MD, Armando Gang   Ward Givens, MD 10/01/12 [phone]

## 2012-10-01 NOTE — ED Provider Notes (Signed)
CSN: [id number]     Arrival date & time 10/01/12  1518 History   First MD Initiated Contact with Patient 10/01/12 1745     Chief Complaint  Patient presents with  . Chest Pain   (Consider location/radiation/quality/duration/timing/severity/associated sxs/prior Treatment) HPI Patient is a 27 year old male who presents to emergency department with complaint of right upper cord and pain and left-sided chest pain. Patient states abdominal pain began several weeks ago. States it comes and goes. Patient denies associated nausea or vomiting. States normal bowel movements. Denies difficulty eating. Has not tried any medications for this. Patient also reports a left upper chest pain. States pain is sharp and constant. States began 2 days ago. No associated shortness of breath. No fever, chills no cough. No history of the same. Patient denies any injuries or lifting any heavy objects. Patient states his chest is tender and he has tenderness going down left arm. Denies numbness or weakness in extremities. Denies pain worsening with deep breathing. Past Medical History  Diagnosis Date  . Alcoholic     pt denies  . Asthma    History reviewed. No pertinent past surgical history. Family History  Problem Relation Age of Onset  . Diabetes Other    History  Substance Use Topics  . Smoking status: Former Games developer  . Smokeless tobacco: Not on file  . Alcohol Use: No     Comment: 89yr ago    Review of Systems  Constitutional: Negative for fever and chills.  HENT: Negative for neck pain and neck stiffness.   Respiratory: Negative for cough, chest tightness and shortness of breath.   Cardiovascular: Positive for chest pain. Negative for palpitations and leg swelling.  Gastrointestinal: Positive for abdominal pain. Negative for nausea, vomiting, diarrhea and abdominal distention.  Genitourinary: Negative for dysuria, urgency, frequency, hematuria and testicular pain.  Musculoskeletal: Negative for myalgias  and arthralgias.  Skin: Negative for rash.  Allergic/Immunologic: Negative for immunocompromised state.  Neurological: Negative for dizziness, weakness, light-headedness, numbness and headaches.    Allergies  Review of patient's allergies indicates no known allergies.  Home Medications  No current outpatient prescriptions on file. BP 112/70  Pulse 74  Temp(Src) 98.5 F (36.9 C) (Oral)  Resp 16  SpO2 98% Physical Exam  Nursing note and vitals reviewed. Constitutional: He is oriented to person, place, and time. He appears well-developed and well-nourished. No distress.  HENT:  Head: Normocephalic and atraumatic.  Eyes: Conjunctivae are normal. Pupils are equal, round, and reactive to light.  Neck: Neck supple.  Cardiovascular: Normal rate, regular rhythm and normal heart sounds.   Pulmonary/Chest: Effort normal. No respiratory distress. He has no wheezes. He has no rales. He exhibits tenderness.  Tender over left clavicle and left upper chest. No bruising or swelling noted. No crepitus.  Abdominal: Soft. Bowel sounds are normal. He exhibits no distension. There is no tenderness. There is no rebound and no guarding.  Right upper quadrant and epigastric tenderness.  Musculoskeletal: He exhibits no edema.  Neurological: He is alert and oriented to person, place, and time.  Skin: Skin is warm and dry.    ED Course  Procedures (including critical care time)  Results for orders placed during the hospital encounter of 10/01/12  URINALYSIS, ROUTINE W REFLEX MICROSCOPIC      Result Value Range   Color, Urine YELLOW  YELLOW   APPearance CLOUDY (*) CLEAR   Specific Gravity, Urine 1.029  1.005 - 1.030   pH 7.0  5.0 - 8.0  Glucose, UA NEGATIVE  NEGATIVE mg/dL   Hgb urine dipstick NEGATIVE  NEGATIVE   Bilirubin Urine NEGATIVE  NEGATIVE   Ketones, ur NEGATIVE  NEGATIVE mg/dL   Protein, ur NEGATIVE  NEGATIVE mg/dL   Urobilinogen, UA 1.0  0.0 - 1.0 mg/dL   Nitrite NEGATIVE  NEGATIVE    Leukocytes, UA NEGATIVE  NEGATIVE  CBC WITH DIFFERENTIAL      Result Value Range   WBC 8.6  4.0 - 10.5 K/uL   RBC 4.79  4.22 - 5.81 MIL/uL   Hemoglobin 14.6  13.0 - 17.0 g/dL   HCT 45.4  09.8 - 11.9 %   MCV 90.4  78.0 - 100.0 fL   MCH 30.5  26.0 - 34.0 pg   MCHC 33.7  30.0 - 36.0 g/dL   RDW 14.7  82.9 - 56.2 %   Platelets 274  150 - 400 K/uL   Neutrophils Relative % 61  43 - 77 %   Neutro Abs 5.2  1.7 - 7.7 K/uL   Lymphocytes Relative 27  12 - 46 %   Lymphs Abs 2.3  0.7 - 4.0 K/uL   Monocytes Relative 11  3 - 12 %   Monocytes Absolute 0.9  0.1 - 1.0 K/uL   Eosinophils Relative 2  0 - 5 %   Eosinophils Absolute 0.1  0.0 - 0.7 K/uL   Basophils Relative 0  0 - 1 %   Basophils Absolute 0.0  0.0 - 0.1 K/uL  HEPATIC FUNCTION PANEL      Result Value Range   Total Protein 7.4  6.0 - 8.3 g/dL   Albumin 4.1  3.5 - 5.2 g/dL   AST 20  0 - 37 U/L   ALT 11  0 - 53 U/L   Alkaline Phosphatase 62  39 - 117 U/L   Total Bilirubin 0.4  0.3 - 1.2 mg/dL   Bilirubin, Direct <1.3  0.0 - 0.3 mg/dL   Indirect Bilirubin NOT CALCULATED  0.3 - 0.9 mg/dL  LIPASE, BLOOD      Result Value Range   Lipase 16  11 - 59 U/L  POCT I-STAT, CHEM 8      Result Value Range   Sodium 140  135 - 145 mEq/L   Potassium 3.9  3.5 - 5.1 mEq/L   Chloride 102  96 - 112 mEq/L   BUN 12  6 - 23 mg/dL   Creatinine, Ser 0.86  0.50 - 1.35 mg/dL   Glucose, Bld 89  70 - 99 mg/dL   Calcium, Ion 5.78  1.12 - 1.23 mmol/L   TCO2 27  0 - 100 mmol/L   Hemoglobin 15.3  13.0 - 17.0 g/dL   HCT 46.9  62.9 - 52.8 %  POCT I-STAT TROPONIN I      Result Value Range   Troponin i, poc 0.00  0.00 - 0.08 ng/mL   Comment 3            Dg Chest 2 View  10/01/2012   *RADIOLOGY REPORT*  Clinical Data: Left chest pain  CHEST - 2 VIEW  Comparison: 08/03/2011  Findings: The lungs remain clear.  Negative for heart failure or pneumonia.  No effusion or mass lesion.  IMPRESSION: Negative   Original Report Authenticated By: Janeece Riggers, M.D.       Date: 10/01/2012  Rate: 69  Rhythm: normal sinus rhythm  QRS Axis: normal  Intervals: normal  ST/T Wave abnormalities: normal  Conduction Disutrbances: none  Narrative Interpretation:  Old EKG Reviewed: No old     MDM   1. Chest wall pain   2. Abdominal  pain, other specified site     Patient with left upper chest wall pain and with right upper quadrant pain. Patient is in no distress and nontoxic appearing EKG is normal. Oh labs including LFTs and lipase are normal. Is tenderness in right upper quadrant is minimal and has been there for several months. His chest pain is reproducible with palpation of her left upper chest. He is neurovascularly intact. I suspect his abdominal pain is either musculoskeletal or do to a gallbladder disease. However this time he has no signs of cholecystitis and will need further outpatient workup. Patient's chest pain is most likely musculoskeletal. His chest x-ray is negative, troponin is negative, EKG is normal. He's per can negative. He'll be started on anti-inflammatories for his pain. He'll followup with primary care Dr.  Ceasar Mons Vitals:   10/01/12 1535  BP: 112/70  Pulse: 74  Temp: 98.5 F (36.9 C)  TempSrc: Oral  Resp: 16  SpO2: 98%       Lottie Mussel, PA-C 10/01/12 1941

## 2012-10-23 ENCOUNTER — Encounter (HOSPITAL_COMMUNITY): Payer: Self-pay | Admitting: Emergency Medicine

## 2012-10-23 ENCOUNTER — Emergency Department (HOSPITAL_COMMUNITY)
Admission: EM | Admit: 2012-10-23 | Discharge: 2012-10-23 | Disposition: A | Discharge status: Home / Self Care | Payer: Self-pay | Attending: Emergency Medicine | Admitting: Emergency Medicine

## 2012-10-23 ENCOUNTER — Emergency Department (HOSPITAL_COMMUNITY): Payer: Self-pay

## 2012-10-23 ENCOUNTER — Other Ambulatory Visit: Payer: Self-pay

## 2012-10-23 DIAGNOSIS — J45909 Unspecified asthma, uncomplicated: Secondary | ICD-10-CM | POA: Insufficient documentation

## 2012-10-23 DIAGNOSIS — R071 Chest pain on breathing: Secondary | ICD-10-CM | POA: Insufficient documentation

## 2012-10-23 DIAGNOSIS — Z87891 Personal history of nicotine dependence: Secondary | ICD-10-CM | POA: Insufficient documentation

## 2012-10-23 DIAGNOSIS — R42 Dizziness and giddiness: Secondary | ICD-10-CM | POA: Insufficient documentation

## 2012-10-23 DIAGNOSIS — R0789 Other chest pain: Secondary | ICD-10-CM | POA: Diagnosis present

## 2012-10-23 LAB — COMPREHENSIVE METABOLIC PANEL WITH GFR
ALT: 14 U/L (ref 0–53)
AST: 15 U/L (ref 0–37)
Albumin: 4.3 g/dL (ref 3.5–5.2)
Alkaline Phosphatase: 76 U/L (ref 39–117)
BUN: 12 mg/dL (ref 6–23)
CO2: 26 meq/L (ref 19–32)
Calcium: 9.2 mg/dL (ref 8.4–10.5)
Chloride: 99 meq/L (ref 96–112)
Creatinine, Ser: 0.96 mg/dL (ref 0.50–1.35)
GFR calc Af Amer: >90 mL/min
GFR calc non Af Amer: >90 mL/min
Glucose, Bld: 95 mg/dL (ref 70–99)
Potassium: 3.6 meq/L (ref 3.5–5.1)
Sodium: 137 meq/L (ref 135–145)
Total Bilirubin: 0.4 mg/dL (ref 0.3–1.2)
Total Protein: 8.2 g/dL (ref 6.0–8.3)

## 2012-10-23 LAB — CBC WITH DIFFERENTIAL/PLATELET
Basophils Relative: 0 % (ref 0–1)
HCT: 43 % (ref 39.0–52.0)
Hemoglobin: 14.7 g/dL (ref 13.0–17.0)
Lymphocytes Relative: 33 % (ref 12–46)
Lymphs Abs: 3.5 10*3/uL (ref 0.7–4.0)
Monocytes Relative: 8 % (ref 3–12)
Neutro Abs: 6.2 10*3/uL (ref 1.7–7.7)
Neutrophils Relative %: 57 % (ref 43–77)
RBC: 4.8 MIL/uL (ref 4.22–5.81)
WBC: 10.8 10*3/uL — ABNORMAL HIGH (ref 4.0–10.5)

## 2012-10-23 LAB — TROPONIN I: Troponin I: <0.3 ng/mL

## 2012-10-23 MED ORDER — ACETAMINOPHEN 325 MG PO TABS
650.0000 mg | ORAL_TABLET | Freq: Once | ORAL | Status: AC
  Administered 2012-10-23: 650 mg via ORAL
  Filled 2012-10-23: qty 2

## 2012-10-23 MED ORDER — LORAZEPAM 0.5 MG PO TABS
0.5000 mg | ORAL_TABLET | Freq: Once | ORAL | Status: AC
  Administered 2012-10-23: 0.5 mg via ORAL
  Filled 2012-10-23: qty 1
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
## 2012-10-23 NOTE — Progress Notes (Signed)
Patient confirms he does not have a pcp.  [facility] provided patient with a list of pcps who accept self pay patients, list of dicounted pharmacies and website needymeds.org for medication assistance, information regarding Medicaid and Affordable Care Act for insurance, list of financial assistance in the community.  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  EDCM stressed importance in finding a pcp. Patient verbalized understanding and thankful for resources. No further needs at this time.

## 2012-10-23 NOTE — ED Notes (Signed)
Patient transported to X-ray 

## 2012-10-23 NOTE — ED Provider Notes (Signed)
CSN: [id number]     Arrival date & time 10/23/12  1737 History   First MD Initiated Contact with Patient 10/23/12 1738     Chief Complaint  Patient presents with  . Chest Pain   (Consider location/radiation/quality/duration/timing/severity/associated sxs/prior Treatment) Patient is a 27 y.o. male presenting with chest pain. The history is provided by the patient.  Chest Pain Pain location:  L chest Pain quality: tightness   Pain radiates to:  L arm Pain radiates to the back: no   Pain severity:  Moderate Onset quality:  Gradual Duration:  1 week Timing:  Intermittent Progression:  Unchanged Chronicity:  Recurrent Context: not breathing   Relieved by:  Nothing Worsened by:  Nothing tried Ineffective treatments:  None tried Associated symptoms: cough (mild) and dizziness   Associated symptoms: no abdominal pain, no fever, no headache, no nausea, no numbness, no shortness of breath and not vomiting     Past Medical History  Diagnosis Date  . Alcoholic     pt denies  . Asthma    No past surgical history on file. Family History  Problem Relation Age of Onset  . Diabetes Other    History  Substance Use Topics  . Smoking status: Former Games developer  . Smokeless tobacco: Not on file  . Alcohol Use: No     Comment: 59yr ago    Review of Systems  Constitutional: Negative for fever.  HENT: Negative for rhinorrhea, drooling and neck pain.   Eyes: Negative for pain.  Respiratory: Positive for cough (mild). Negative for shortness of breath.   Cardiovascular: Positive for chest pain. Negative for leg swelling.  Gastrointestinal: Negative for nausea, vomiting, abdominal pain and diarrhea.  Genitourinary: Negative for dysuria and hematuria.  Musculoskeletal: Negative for gait problem.  Skin: Negative for color change.  Neurological: Positive for dizziness. Negative for numbness and headaches.  Hematological: Negative for adenopathy.  Psychiatric/Behavioral: Negative for behavioral  problems.  All other systems reviewed and are negative.    Allergies  Review of patient's allergies indicates no known allergies.  Home Medications   Current Outpatient Rx  Name  Route  Sig  Dispense  Refill  . ibuprofen (ADVIL,MOTRIN) 600 MG tablet   Oral   Take 1 tablet (600 mg total) by mouth every 6 (six) hours as needed for pain.   30 tablet   0    There were no vitals taken for this visit. Physical Exam  Nursing note and vitals reviewed. Constitutional: He is oriented to person, place, and time. He appears well-developed and well-nourished.  HENT:  Head: Normocephalic and atraumatic.  Right Ear: External ear normal.  Left Ear: External ear normal.  Nose: Nose normal.  Mouth/Throat: Oropharynx is clear and moist. No oropharyngeal exudate.  Eyes: Conjunctivae and EOM are normal. Pupils are equal, round, and reactive to light.  Neck: Normal range of motion. Neck supple.  Cardiovascular: Normal rate, regular rhythm, normal heart sounds and intact distal pulses.  Exam reveals no gallop and no friction rub.   No murmur heard. Pulmonary/Chest: Effort normal and breath sounds normal. No respiratory distress. He has no wheezes. He exhibits tenderness (left upper chest w/ mild ttp).  Abdominal: Soft. Bowel sounds are normal. He exhibits no distension. There is no tenderness. There is no rebound and no guarding.  Musculoskeletal: Normal range of motion. He exhibits no edema and no tenderness.  Neurological: He is alert and oriented to person, place, and time.  Skin: Skin is warm and dry.  Psychiatric:  He has a normal mood and affect. His behavior is normal.    ED Course  Procedures (including critical care time) Labs Review Labs Reviewed  CBC WITH DIFFERENTIAL - Abnormal; Notable for the following:    WBC 10.8 (*)    All other components within normal limits  COMPREHENSIVE METABOLIC PANEL  TROPONIN I   Imaging Review Dg Chest 2 View  10/23/2012   CLINICAL DATA:  Chest  pain and shortness of Breath  EXAM: CHEST  2 VIEW  COMPARISON:  October 01, 2012  FINDINGS: Lungs are clear. Heart size and pulmonary vascularity are normal. No adenopathy. No pneumothorax. No bone lesions.  IMPRESSION: No abnormality noted.   Electronically Signed   By: Bretta Bang   On: 10/23/2012 18:10     Date: 10/23/2012  Rate: 78  Rhythm: normal sinus rhythm  QRS Axis: normal  Intervals: normal  ST/T Wave abnormalities: normal  Conduction Disutrbances:none  Narrative Interpretation: Early repolarization. No new ST or T wave changes consistent with ischemia.    Old EKG Reviewed: unchanged    MDM   1. Chest wall pain    5:52 PM 27 y.o. male who presents with left arm and left-sided chest pain which has been intermittent for approximately one week. The patient states that he occasionally feels presyncopal but he denies any nausea vomiting diarrhea or fevers. The patient is afebrile and vital signs are unremarkable here. He is Wells and perc negative. His chest pain is reproducible with palpation and he states that this is the same pain that he is having in his chest. Possible family history of heart disease but otherwise no other risk factors. Atypical for cardiac. Will get screening labs Tylenol for pain. Ativan 0.5 mg for assoc anxiety.   7:52 PM: Pt feeling better on exam. Low risk for MACE per HEART score. Will rec d/c home w/ suspicion for chest wall pain.  I have discussed the diagnosis/risks/treatment options with the patient and believe the pt to be eligible for discharge home to follow-up with pcp as needed. We also discussed returning to the ED immediately if new or worsening sx occur. We discussed the sx which are most concerning (e.g., worsening pain, sob) that necessitate immediate return. Any new prescriptions provided to the patient are listed below.  New Prescriptions   No medications on file     Junius Argyle, MD 10/24/12 1056

## 2013-01-09 ENCOUNTER — Encounter (HOSPITAL_COMMUNITY): Payer: Self-pay | Admitting: Emergency Medicine

## 2013-01-09 ENCOUNTER — Emergency Department (HOSPITAL_COMMUNITY)
Admission: EM | Admit: 2013-01-09 | Discharge: 2013-01-09 | Disposition: A | Discharge status: Home / Self Care | Payer: Self-pay | Attending: Emergency Medicine | Admitting: Emergency Medicine

## 2013-01-09 ENCOUNTER — Emergency Department (HOSPITAL_COMMUNITY): Payer: Self-pay

## 2013-01-09 DIAGNOSIS — M549 Dorsalgia, unspecified: Secondary | ICD-10-CM | POA: Insufficient documentation

## 2013-01-09 DIAGNOSIS — Z87891 Personal history of nicotine dependence: Secondary | ICD-10-CM | POA: Insufficient documentation

## 2013-01-09 DIAGNOSIS — J45909 Unspecified asthma, uncomplicated: Secondary | ICD-10-CM | POA: Insufficient documentation

## 2013-01-09 DIAGNOSIS — R0789 Other chest pain: Secondary | ICD-10-CM

## 2013-01-09 DIAGNOSIS — Z8659 Personal history of other mental and behavioral disorders: Secondary | ICD-10-CM | POA: Insufficient documentation

## 2013-01-09 DIAGNOSIS — R071 Chest pain on breathing: Secondary | ICD-10-CM | POA: Insufficient documentation

## 2013-01-09 LAB — CBC WITH DIFFERENTIAL/PLATELET
Basophils Absolute: 0 10*3/uL (ref 0.0–0.1)
Basophils Relative: 0 % (ref 0–1)
Eosinophils Absolute: 0.1 10*3/uL (ref 0.0–0.7)
Eosinophils Relative: 1 % (ref 0–5)
Lymphocytes Relative: 18 % (ref 12–46)
MCV: 93 fL (ref 78.0–100.0)
Platelets: 240 10*3/uL (ref 150–400)
RDW: 13.7 % (ref 11.5–15.5)
WBC: 10 10*3/uL (ref 4.0–10.5)

## 2013-01-09 LAB — POCT I-STAT TROPONIN I: Troponin i, poc: 0 ng/mL (ref 0.00–0.08)

## 2013-01-09 LAB — BASIC METABOLIC PANEL
CO2: 27 mEq/L (ref 19–32)
Calcium: 9.2 mg/dL (ref 8.4–10.5)
GFR calc Af Amer: >90 mL/min (ref >90)
GFR calc non Af Amer: >90 mL/min (ref >90)
Sodium: 137 mEq/L (ref 135–145)

## 2013-01-09 MED ORDER — KETOROLAC TROMETHAMINE 30 MG/ML IJ SOLN
30.0000 mg | Freq: Once | INTRAMUSCULAR | Status: AC
  Administered 2013-01-09: 30 mg via INTRAMUSCULAR
  Filled 2013-01-09: qty 1

## 2013-01-09 MED ORDER — NAPROXEN 500 MG PO TABS: 500.0000 mg | ORAL_TABLET | Freq: Two times a day (BID) | ORAL | Status: DC

## 2013-01-09 NOTE — ED Notes (Addendum)
Pt states he had back pain that started 2 days ago, which moved to his right chest this morning. Pt describes pain as sharp and constant. Pt states he has intermittent shortness of breath and weakness. Pt denies nausea, dizziness, diaphoresis.

## 2013-01-09 NOTE — ED Provider Notes (Signed)
CSN: [id number]     Arrival date & time 01/09/13  1735 History   First MD Initiated Contact with Patient 01/09/13 1802     Chief Complaint  Patient presents with  . Back Pain  . Chest Pain   (Consider location/radiation/quality/duration/timing/severity/associated sxs/prior Treatment) HPI Comments: Patient presents today with a complaint of chest pain and back pain.  He reports that he has been having constant pain of his right scapula for the past 2-3 days.  Pain is worse with movement.  He denies any acute injury or trauma.  He has not taken anything for pain prior to arrival.  He reports that this morning he began having pain of the right anterior chest.  Pain has been constant since that time.  Pain does not radiate.  He reports that this pain is also worse with movement.  He reports that the chest pain is similar to the pain that he has had in the past.  Patient denies any prior cardiac history.  Patient denies any cough, SOB, nausea, vomiting, diaphoresis, dizziness, or syncope associated with the pain.  He reports that he does have a history of anxiety.  No history of HTN, DM, or Hyperlipidemia.  Denies family history of cardiac disease.  Denies cocaine use.  He denies any prolonged travel or surgeries in the past 4 weeks.  Denies lower extremity edema.  Denies history of DVT or PE.  The history is provided by the patient.    Past Medical History  Diagnosis Date  . Alcoholic     pt denies  . Asthma    History reviewed. No pertinent past surgical history. Family History  Problem Relation Age of Onset  . Diabetes Other    History  Substance Use Topics  . Smoking status: Former Games developer  . Smokeless tobacco: Not on file  . Alcohol Use: Yes     Comment: social    Review of Systems  All other systems reviewed and are negative.    Allergies  Review of patient's allergies indicates no known allergies.  Home Medications  No current outpatient prescriptions on file. BP 131/72   Pulse 77  Temp(Src) 98.2 F (36.8 C) (Oral)  Resp 20  Ht 5\' 10"  (1.778 m)  Wt 185 lb (83.915 kg)  BMI 26.54 kg/m2  SpO2 98% Physical Exam  Nursing note and vitals reviewed. Constitutional: He appears well-developed and well-nourished. No distress.  HENT:  Head: Normocephalic and atraumatic.  Mouth/Throat: Oropharynx is clear and moist.  Neck: Normal range of motion. Neck supple.  Cardiovascular: Normal rate, regular rhythm and normal heart sounds.   Pulses:      Dorsalis pedis pulses are 2+ on the right side, and 2+ on the left side.  Pulmonary/Chest: Effort normal and breath sounds normal. No respiratory distress. He has no wheezes. He has no rales. He exhibits tenderness.  Abdominal: Soft. Normal appearance and bowel sounds are normal. He exhibits no distension and no mass. There is no tenderness. There is no rebound, no guarding and negative Murphy's sign.  Musculoskeletal: Normal range of motion.  No LE edema bilaterally  Neurological: He is alert.  Skin: Skin is warm and dry. He is not diaphoretic.  Psychiatric: He has a normal mood and affect.    ED Course  Procedures (including critical care time) Labs Review Labs Reviewed - No data to display Imaging Review Dg Chest 2 View  01/09/2013   CLINICAL DATA:  Chest pain and back pain.  EXAM: CHEST  2  VIEW  COMPARISON:  10/23/2012  FINDINGS: Cardiomediastinal silhouette is within normal limits. The lungs are free of focal consolidations and pleural effusions. No pulmonary edema. Visualized osseous structures have a normal appearance.  IMPRESSION: No active cardiopulmonary disease.   Electronically Signed   By: Rosalie Gums M.D.   On: 01/09/2013 18:47    EKG Interpretation    Date/Time:  Thursday January 09 2013 17:42:03 EST Ventricular Rate:  72 PR Interval:  125 QRS Duration: 109 QT Interval:  340 QTC Calculation: 372 R Axis:   28 Text Interpretation:  Normal sinus rhythm Baseline wander in lead(s) V2 Nonspecific  intraventricular conduction delay No significant change since Confirmed by GOLDSTON  MD, SCOTT ([id number]) on 01/09/2013 5:45:31 PM           8:49 PM Reassessed patient.  He reports that symptoms have improved after given Toradol. MDM  No diagnosis found. Patient is a 27 year old male presenting today with chest pain.  He has several past ED visits for the same.  No prior cardiac history.  Heart score is zero.  Chest pain has been constant since this morning.  Troponin negative.  EKG unremarkable.  CXR negative.  Patient is PERC negative.  Chest wall tender to palpation.  Pain also worse with movement.  Pain improved after given Toradol.  Suspect that the pain is musculoskeletal.  Feel that the patient is stable for discharge.  Return precautions given.    Santiago Glad, PA-C 01/10/13 1002

## 2013-01-11 NOTE — ED Provider Notes (Signed)
Medical screening examination/treatment/procedure(s) were performed by non-physician practitioner and as supervising physician I was immediately available for consultation/collaboration.  EKG Interpretation    Date/Time:  Thursday January 09 2013 17:42:03 EST Ventricular Rate:  72 PR Interval:  125 QRS Duration: 109 QT Interval:  340 QTC Calculation: 372 R Axis:   28 Text Interpretation:  Normal sinus rhythm Baseline wander in lead(s) V2 Nonspecific intraventricular conduction delay No significant change since Confirmed by Mackena Plummer  MD, Konnor Jorden ([id number]) on 01/09/2013 5:45:31 PM              Audree Camel, MD 01/11/13 [phone]

## 2013-01-30 ENCOUNTER — Emergency Department (HOSPITAL_COMMUNITY)
Admission: EM | Admit: 2013-01-30 | Discharge: 2013-01-30 | Disposition: A | Discharge status: Home / Self Care | Payer: Self-pay | Attending: Emergency Medicine | Admitting: Emergency Medicine

## 2013-01-30 ENCOUNTER — Encounter (HOSPITAL_COMMUNITY): Payer: Self-pay | Admitting: Emergency Medicine

## 2013-01-30 ENCOUNTER — Emergency Department (HOSPITAL_COMMUNITY): Payer: Self-pay

## 2013-01-30 DIAGNOSIS — J40 Bronchitis, not specified as acute or chronic: Secondary | ICD-10-CM

## 2013-01-30 DIAGNOSIS — Z791 Long term (current) use of non-steroidal anti-inflammatories (NSAID): Secondary | ICD-10-CM | POA: Insufficient documentation

## 2013-01-30 DIAGNOSIS — J45901 Unspecified asthma with (acute) exacerbation: Secondary | ICD-10-CM | POA: Insufficient documentation

## 2013-01-30 DIAGNOSIS — Z79899 Other long term (current) drug therapy: Secondary | ICD-10-CM | POA: Insufficient documentation

## 2013-01-30 DIAGNOSIS — J029 Acute pharyngitis, unspecified: Secondary | ICD-10-CM | POA: Insufficient documentation

## 2013-01-30 DIAGNOSIS — Z87891 Personal history of nicotine dependence: Secondary | ICD-10-CM | POA: Insufficient documentation

## 2013-01-30 LAB — CBC
HEMATOCRIT: 43.5 % (ref 39.0–52.0)
HEMOGLOBIN: 14.8 g/dL (ref 13.0–17.0)
MCH: 31.8 pg (ref 26.0–34.0)
MCHC: 34 g/dL (ref 30.0–36.0)
MCV: 93.5 fL (ref 78.0–100.0)
PLATELETS: 250 10*3/uL (ref 150–400)
RBC: 4.65 MIL/uL (ref 4.22–5.81)
RDW: 13.7 % (ref 11.5–15.5)
WBC: 8.8 10*3/uL (ref 4.0–10.5)

## 2013-01-30 LAB — BASIC METABOLIC PANEL
BUN: 10 mg/dL (ref 6–23)
CHLORIDE: 104 meq/L (ref 96–112)
CO2: 25 meq/L (ref 19–32)
Calcium: 9.1 mg/dL (ref 8.4–10.5)
Creatinine, Ser: 0.87 mg/dL (ref 0.50–1.35)
GFR calc Af Amer: >90 mL/min (ref >90)
Glucose, Bld: 100 mg/dL — ABNORMAL HIGH (ref 70–99)
POTASSIUM: 3.9 meq/L (ref 3.7–5.3)
SODIUM: 142 meq/L (ref 137–147)

## 2013-01-30 MED ORDER — DM-GUAIFENESIN ER 30-600 MG PO TB12: 1.0000 | ORAL_TABLET | Freq: Two times a day (BID) | ORAL | Status: DC

## 2013-01-30 MED ORDER — ALBUTEROL SULFATE HFA 108 (90 BASE) MCG/ACT IN AERS: 1.0000 | INHALATION_SPRAY | Freq: Four times a day (QID) | RESPIRATORY_TRACT | Status: DC | PRN

## 2013-01-30 NOTE — ED Notes (Signed)
Per EMS pt stated he has had a cough for 2 weeks.  When pt woke up this am he felt SOB.  Pt tried OTC cough medicine w/ relief of sx however pt only took this once.

## 2013-01-30 NOTE — ED Provider Notes (Addendum)
CSN: [id number]     Arrival date & time 01/30/13  0601 History   First MD Initiated Contact with Patient 01/30/13 [phone]     Chief Complaint  Patient presents with  . Cough   (Consider location/radiation/quality/duration/timing/severity/associated sxs/prior Treatment) Patient is a 28 y.o. male presenting with cough. The history is provided by the patient.  Cough Associated symptoms: shortness of breath and sore throat   Associated symptoms: no chest pain, no fever, no headaches, no rash and no wheezing    patient with 2 week history of cough and congestion started out with a sore throat now resolved. Cough has persisted. Patient awoke this morning feeling very short of breath. Patient has been using Robitussin-DM cough medicine at home. Denies any fevers currently. History of asthma in the past.  Past Medical History  Diagnosis Date  . Alcoholic     pt denies  . Asthma     Has not had attack since childhood   History reviewed. No pertinent past surgical history. Family History  Problem Relation Age of Onset  . Diabetes Other    History  Substance Use Topics  . Smoking status: Former Games developermoker  . Smokeless tobacco: Not on file  . Alcohol Use: Yes     Comment: social    Review of Systems  Constitutional: Negative for fever.  HENT: Positive for congestion and sore throat.   Eyes: Negative for redness.  Respiratory: Positive for cough and shortness of breath. Negative for wheezing.   Cardiovascular: Negative for chest pain.  Gastrointestinal: Negative for nausea, vomiting and abdominal pain.  Genitourinary: Negative for dysuria.  Musculoskeletal: Negative for back pain.  Skin: Negative for rash.  Neurological: Negative for headaches.  Hematological: Does not bruise/bleed easily.  Psychiatric/Behavioral: Negative for confusion.    Allergies  Review of patient's allergies indicates no known allergies.  Home Medications   Current Outpatient Rx  Name  Route  Sig  Dispense   Refill  . naproxen (NAPROSYN) 500 MG tablet   Oral   Take 1 tablet (500 mg total) by mouth 2 (two) times daily.   30 tablet   0   . albuterol (PROVENTIL HFA;VENTOLIN HFA) 108 (90 BASE) MCG/ACT inhaler   Inhalation   Inhale 1-2 puffs into the lungs every 6 (six) hours as needed for wheezing or shortness of breath.   1 Inhaler   0   . dextromethorphan-guaiFENesin (MUCINEX DM) 30-600 MG per 12 hr tablet   Oral   Take 1 tablet by mouth 2 (two) times daily.   14 tablet   1    BP 127/70  Pulse 78  Temp(Src) 98.6 F (37 C) (Oral)  Resp 16  SpO2 96% Physical Exam  Nursing note and vitals reviewed. Constitutional: He is oriented to person, place, and time. He appears well-developed and well-nourished. No distress.  HENT:  Head: Normocephalic and atraumatic.  Mouth/Throat: Oropharynx is clear and moist. No oropharyngeal exudate.  Eyes: Conjunctivae are normal. Pupils are equal, round, and reactive to light.  Neck: Normal range of motion.  Cardiovascular: Normal rate, regular rhythm and normal heart sounds.   No murmur heard. Pulmonary/Chest: Effort normal and breath sounds normal. No respiratory distress. He has no wheezes. He has no rales.  Abdominal: Soft. Bowel sounds are normal. There is no tenderness.  Musculoskeletal: Normal range of motion. He exhibits no tenderness.  Neurological: He is alert and oriented to person, place, and time. No cranial nerve deficit. He exhibits normal muscle tone. Coordination normal.  Skin: Skin is warm. No rash noted.    ED Course  Procedures (including critical care time) Labs Review Labs Reviewed  BASIC METABOLIC PANEL - Abnormal; Notable for the following:    Glucose, Bld 100 (*)    All other components within normal limits  CBC   Results for orders placed during the hospital encounter of 01/30/13  BASIC METABOLIC PANEL      Result Value Range   Sodium 142  137 - 147 mEq/L   Potassium 3.9  3.7 - 5.3 mEq/L   Chloride 104  96 - 112  mEq/L   CO2 25  19 - 32 mEq/L   Glucose, Bld 100 (*) 70 - 99 mg/dL   BUN 10  6 - 23 mg/dL   Creatinine, Ser 5.78  0.50 - 1.35 mg/dL   Calcium 9.1  8.4 - 46.9 mg/dL   GFR calc non Af Amer >90  >90 mL/min   GFR calc Af Amer >90  >90 mL/min  CBC      Result Value Range   WBC 8.8  4.0 - 10.5 K/uL   RBC 4.65  4.22 - 5.81 MIL/uL   Hemoglobin 14.8  13.0 - 17.0 g/dL   HCT 62.9  52.8 - 41.3 %   MCV 93.5  78.0 - 100.0 fL   MCH 31.8  26.0 - 34.0 pg   MCHC 34.0  30.0 - 36.0 g/dL   RDW 24.4  01.0 - 27.2 %   Platelets 250  150 - 400 K/uL    Imaging Review Dg Chest 2 View  01/30/2013   CLINICAL DATA:  Cough  EXAM: CHEST  2 VIEW  COMPARISON:  Prior radiograph from 01/09/2013  FINDINGS: The cardiac and mediastinal silhouettes are stable in size and contour, and remain within normal limits.  The lungs are normally inflated. No airspace consolidation, pleural effusion, or pulmonary edema is identified. There is no pneumothorax.  No acute osseous abnormality identified.  IMPRESSION: No active cardiopulmonary disease.   Electronically Signed   By: Rise Mu M.D.   On: 01/30/2013 06:41    EKG Interpretation   None       MDM   1. Bronchitis    Chest x-ray negative for pneumonia. No leukocytosis. Most likely viral bronchitis following viral respiratory infection. Patient was treated with albuterol and Mucinex DM precautions to return for any newer worse symptoms.  Patient with history of asthma in the past currently no wheezing lungs are clear.    Shelda Jakes, MD 01/30/13 0730  Shelda Jakes, MD 01/30/13 0730

## 2013-01-30 NOTE — Discharge Instructions (Signed)
Bronchitis Bronchitis is a problem of the air tubes leading to your lungs. This problem makes it hard for air to get in and out of the lungs. You may cough a lot because your air tubes are narrow. Going without care can cause lasting (chronic) bronchitis. HOME CARE   Drink enough fluids to keep your pee (urine) clear or pale yellow.  Use a cool mist humidifier.  Quit smoking if you smoke. If you keep smoking, the bronchitis might not get better.  Only take medicine as told by your doctor. GET HELP RIGHT AWAY IF:   Coughing keeps you awake.  You start to wheeze.  You become more sick or weak.  You have a hard time breathing or get short of breath.  You cough up blood.  Coughing lasts more than 2 weeks.  You have a fever.  Your baby is older than 3 months with a rectal temperature of 102 F (38.9 C) or higher.  Your baby is 453 months old or younger with a rectal temperature of 100.4 F (38 C) or higher. MAKE SURE YOU:  Understand these instructions.  Will watch your condition.  Will get help right away if you are not doing well or get worse. Document Released: 07/05/2007 Document Revised: 04/10/2011 Document Reviewed: 09/10/2012 [facility] Patient Information 2014 [place], [place].  Take Mucinex DM as directed. Return for any new or worse symptoms. Also the albuterol inhaler will help suppress the cough can keep your lungs open. Work note provided.

## 2013-01-30 NOTE — ED Notes (Signed)
Bed: WA17 Expected date: 01/30/13 Expected time: 5:50 AM Means of arrival: Ambulance Comments: cough

## 2013-01-30 NOTE — ED Notes (Signed)
MD at bedside. 

## 2014-01-06 ENCOUNTER — Emergency Department (HOSPITAL_COMMUNITY)
Admission: EM | Admit: 2014-01-06 | Discharge: 2014-01-06 | Disposition: A | Discharge status: Home / Self Care | Payer: Self-pay | Attending: Emergency Medicine | Admitting: Emergency Medicine

## 2014-01-06 ENCOUNTER — Encounter (HOSPITAL_COMMUNITY): Payer: Self-pay | Admitting: Emergency Medicine

## 2014-01-06 ENCOUNTER — Emergency Department (HOSPITAL_COMMUNITY): Payer: Self-pay

## 2014-01-06 DIAGNOSIS — Z79899 Other long term (current) drug therapy: Secondary | ICD-10-CM | POA: Insufficient documentation

## 2014-01-06 DIAGNOSIS — J45909 Unspecified asthma, uncomplicated: Secondary | ICD-10-CM | POA: Insufficient documentation

## 2014-01-06 DIAGNOSIS — R630 Anorexia: Secondary | ICD-10-CM | POA: Insufficient documentation

## 2014-01-06 DIAGNOSIS — R110 Nausea: Secondary | ICD-10-CM | POA: Insufficient documentation

## 2014-01-06 DIAGNOSIS — Z87891 Personal history of nicotine dependence: Secondary | ICD-10-CM | POA: Insufficient documentation

## 2014-01-06 DIAGNOSIS — R079 Chest pain, unspecified: Secondary | ICD-10-CM

## 2014-01-06 DIAGNOSIS — R091 Pleurisy: Secondary | ICD-10-CM | POA: Insufficient documentation

## 2014-01-06 DIAGNOSIS — R1011 Right upper quadrant pain: Secondary | ICD-10-CM | POA: Insufficient documentation

## 2014-01-06 LAB — CBC WITH DIFFERENTIAL/PLATELET
BASOS PCT: 0 % (ref 0–1)
Basophils Absolute: 0 10*3/uL (ref 0.0–0.1)
Eosinophils Absolute: 0.2 10*3/uL (ref 0.0–0.7)
Eosinophils Relative: 2 % (ref 0–5)
HEMATOCRIT: 45.1 % (ref 39.0–52.0)
Hemoglobin: 15.2 g/dL (ref 13.0–17.0)
LYMPHS ABS: 2 10*3/uL (ref 0.7–4.0)
Lymphocytes Relative: 26 % (ref 12–46)
MCH: 31.6 pg (ref 26.0–34.0)
MCHC: 33.7 g/dL (ref 30.0–36.0)
MCV: 93.8 fL (ref 78.0–100.0)
MONOS PCT: 15 % — AB (ref 3–12)
Monocytes Absolute: 1.2 10*3/uL — ABNORMAL HIGH (ref 0.1–1.0)
NEUTROS ABS: 4.5 10*3/uL (ref 1.7–7.7)
Neutrophils Relative %: 57 % (ref 43–77)
Platelets: 251 10*3/uL (ref 150–400)
RBC: 4.81 MIL/uL (ref 4.22–5.81)
RDW: 13.3 % (ref 11.5–15.5)
WBC: 7.9 10*3/uL (ref 4.0–10.5)

## 2014-01-06 LAB — URINALYSIS, ROUTINE W REFLEX MICROSCOPIC
BILIRUBIN URINE: NEGATIVE
Glucose, UA: NEGATIVE mg/dL
Hgb urine dipstick: NEGATIVE
Ketones, ur: NEGATIVE mg/dL
Leukocytes, UA: NEGATIVE
NITRITE: NEGATIVE
PROTEIN: NEGATIVE mg/dL
Specific Gravity, Urine: 1.025 (ref 1.005–1.030)
UROBILINOGEN UA: 1 mg/dL (ref 0.0–1.0)
pH: 7.5 (ref 5.0–8.0)

## 2014-01-06 LAB — COMPREHENSIVE METABOLIC PANEL
ALBUMIN: 4.2 g/dL (ref 3.5–5.2)
ALK PHOS: 75 U/L (ref 39–117)
ALT: 21 U/L (ref 0–53)
ANION GAP: 11 (ref 5–15)
AST: 26 U/L (ref 0–37)
BUN: 13 mg/dL (ref 6–23)
CALCIUM: 9.6 mg/dL (ref 8.4–10.5)
CO2: 26 mEq/L (ref 19–32)
Chloride: 100 mEq/L (ref 96–112)
Creatinine, Ser: 0.95 mg/dL (ref 0.50–1.35)
GFR calc non Af Amer: >90 mL/min (ref >90)
Glucose, Bld: 95 mg/dL (ref 70–99)
POTASSIUM: 4.6 meq/L (ref 3.7–5.3)
Sodium: 137 mEq/L (ref 137–147)
TOTAL PROTEIN: 8.2 g/dL (ref 6.0–8.3)
Total Bilirubin: 0.5 mg/dL (ref 0.3–1.2)

## 2014-01-06 LAB — LIPASE, BLOOD: Lipase: 23 U/L (ref 11–59)

## 2014-01-06 NOTE — ED Notes (Signed)
Pt alert and oriented x4. Respirations even and unlabored, bilateral symmetrical rise and fall of chest. Skin warm and dry. In no acute distress. Denies needs.   

## 2014-01-06 NOTE — ED Provider Notes (Signed)
CSN: [id number]     Arrival date & time 01/06/14  1055 History   First MD Initiated Contact with Patient 01/06/14 1117     Chief Complaint  Patient presents with  . Nausea  . Rib cage pain      (Consider location/radiation/quality/duration/timing/severity/associated sxs/prior Treatment) HPI Implanted right-sided rib pain for 1 year waxes and wanes worse when he changes positions feels like "something gets stuck when I take a deep breath points to right lateral rib cage. Pain is worse when he changes positions improved when he remains still Patient also reports early satiety and nausea for 1 year. Questionable weight loss he has not weighed himself. No treatment prior to coming here. No other associated symptoms no fever Past Medical History  Diagnosis Date  . Alcoholic     pt denies  . Asthma     Has not had attack since childhood   History reviewed. No pertinent past surgical history. Family History  Problem Relation Age of Onset  . Diabetes Other    History  Substance Use Topics  . Smoking status: Former Games developermoker  . Smokeless tobacco: Not on file  . Alcohol Use: Yes     Comment: social   quit smoking several years ago. No alcohol for 2 months. No illicit drug use  Review of Systems  Constitutional: Positive for appetite change.  Cardiovascular: Positive for chest pain.       Right-sided rib cage pain  Gastrointestinal: Positive for nausea.      Allergies  Review of patient's allergies indicates no known allergies.  Home Medications   Prior to Admission medications   Medication Sig Start Date End Date Taking? Authorizing Provider  albuterol (PROVENTIL HFA;VENTOLIN HFA) 108 (90 BASE) MCG/ACT inhaler Inhale 1-2 puffs into the lungs every 6 (six) hours as needed for wheezing or shortness of breath. 01/30/13  Yes Vanetta MuldersScott Zackowski, MD  dextromethorphan-guaiFENesin [facility](MUCINEX DM) 30-600 MG per 12 hr tablet Take 1 tablet by mouth 2 (two) times daily. Patient not taking: Reported  on 01/06/2014 01/30/13   Vanetta MuldersScott Zackowski, MD  naproxen (NAPROSYN) 500 MG tablet Take 1 tablet (500 mg total) by mouth 2 (two) times daily. Patient not taking: Reported on 01/06/2014 01/09/13   Heather Laisure, PA-C   BP 123/48 mmHg  Pulse 62  Temp(Src) 98.3 F (36.8 C) (Oral)  Resp 18  SpO2 96% Physical Exam  Constitutional: He appears well-developed and well-nourished.  HENT:  Head: Normocephalic and atraumatic.  Eyes: Conjunctivae are normal. Pupils are equal, round, and reactive to light.  Neck: Neck supple. No tracheal deviation present. No thyromegaly present.  Cardiovascular: Normal rate and regular rhythm.   No murmur heard. Pulmonary/Chest: Effort normal and breath sounds normal.  Right anterior and posterior rib cage mildly tender.  Abdominal: Soft. Bowel sounds are normal. He exhibits no distension. There is no tenderness.  Mild tenderness right upper quadrant. Negative Murphy sign.  Musculoskeletal: Normal range of motion. He exhibits no edema or tenderness.  Neurological: He is alert. Coordination normal.  Skin: Skin is warm and dry. No rash noted.  Psychiatric: He has a normal mood and affect.  Nursing note and vitals reviewed.   ED Course  Procedures (including critical care time) Labs Review Labs Reviewed - No data to display  Imaging Review No results found.   EKG Interpretation None     declines pain medicine   1:30 PM patient resting comfortably. Chest x-ray reviewed by me Results for orders placed or performed during the hospital  encounter of 01/06/14  Urinalysis, Routine w reflex microscopic  Result Value Ref Range   Color, Urine YELLOW YELLOW   APPearance CLEAR CLEAR   Specific Gravity, Urine 1.025 1.005 - 1.030   pH 7.5 5.0 - 8.0   Glucose, UA NEGATIVE NEGATIVE mg/dL   Hgb urine dipstick NEGATIVE NEGATIVE   Bilirubin Urine NEGATIVE NEGATIVE   Ketones, ur NEGATIVE NEGATIVE mg/dL   Protein, ur NEGATIVE NEGATIVE mg/dL   Urobilinogen, UA 1.0 0.0 -  1.0 mg/dL   Nitrite NEGATIVE NEGATIVE   Leukocytes, UA NEGATIVE NEGATIVE  Comprehensive metabolic panel  Result Value Ref Range   Sodium 137 137 - 147 mEq/L   Potassium 4.6 3.7 - 5.3 mEq/L   Chloride 100 96 - 112 mEq/L   CO2 26 19 - 32 mEq/L   Glucose, Bld 95 70 - 99 mg/dL   BUN 13 6 - 23 mg/dL   Creatinine, Ser 1.610.95 0.50 - 1.35 mg/dL   Calcium 9.6 8.4 - 09.610.5 mg/dL   Total Protein 8.2 6.0 - 8.3 g/dL   Albumin 4.2 3.5 - 5.2 g/dL   AST 26 0 - 37 U/L   ALT 21 0 - 53 U/L   Alkaline Phosphatase 75 39 - 117 U/L   Total Bilirubin 0.5 0.3 - 1.2 mg/dL   GFR calc non Af Amer >90 >90 mL/min   GFR calc Af Amer >90 >90 mL/min   Anion gap 11 5 - 15  CBC with Differential  Result Value Ref Range   WBC 7.9 4.0 - 10.5 K/uL   RBC 4.81 4.22 - 5.81 MIL/uL   Hemoglobin 15.2 13.0 - 17.0 g/dL   HCT 04.545.1 40.939.0 - 81.152.0 %   MCV 93.8 78.0 - 100.0 fL   MCH 31.6 26.0 - 34.0 pg   MCHC 33.7 30.0 - 36.0 g/dL   RDW 91.413.3 78.211.5 - 95.615.5 %   Platelets 251 150 - 400 K/uL   Neutrophils Relative % 57 43 - 77 %   Neutro Abs 4.5 1.7 - 7.7 K/uL   Lymphocytes Relative 26 12 - 46 %   Lymphs Abs 2.0 0.7 - 4.0 K/uL   Monocytes Relative 15 (H) 3 - 12 %   Monocytes Absolute 1.2 (H) 0.1 - 1.0 K/uL   Eosinophils Relative 2 0 - 5 %   Eosinophils Absolute 0.2 0.0 - 0.7 K/uL   Basophils Relative 0 0 - 1 %   Basophils Absolute 0.0 0.0 - 0.1 K/uL  Lipase, blood  Result Value Ref Range   Lipase 23 11 - 59 U/L   Dg Chest 2 View  01/06/2014   CLINICAL DATA:  Chest pain  EXAM: CHEST  2 VIEW  COMPARISON:  01/30/2013  FINDINGS: The heart size and mediastinal contours are within normal limits. Both lungs are clear. The visualized skeletal structures are unremarkable.  IMPRESSION: No active cardiopulmonary disease.   Electronically Signed   By: Marlan Palauharles  Clark M.D.   On: 01/06/2014 13:05    MDM  Etiology of nausea and appetite changes unclear. Symptoms are suggestive of pleurisy. Plan Tylenol or Advil for pain. Referral to resource  guide Diagnosis #1 pleurisy #2 nausea etiology unclear Final diagnoses:  None        Doug SouSam Zelina Jimerson, MD 01/06/14 1338

## 2014-01-06 NOTE — ED Notes (Signed)
Pt escorted to discharge window. Pt verbalized understanding discharge instructions. In no acute distress.  

## 2014-01-06 NOTE — ED Notes (Signed)
Pt c/o right sided rib cage pain worsened with inspiration, c/o nausea after eating. Lung sounds present all fields. Pt denies trauma.

## 2014-01-06 NOTE — Discharge Instructions (Signed)
Pleurisy Take Tylenol or Advil for pain. Call any of the numbers on the resource guide to get a primary care physician to help determine the cause of your nausea and loss of appetite. Your lab work today and chest x-ray were normal Pleurisy is an inflammation and swelling of the lining of the lungs (pleura). Because of this inflammation, it hurts to breathe. It can be aggravated by coughing, laughing, or deep breathing.  HOME CARE INSTRUCTIONS  Monitor your pleurisy for any changes. The following actions may help to alleviate any discomfort you are experiencing:  Medicine may help with pain. Only take over-the-counter or prescription medicines for pain, discomfort, or fever as directed by your health care provider.  Only take antibiotic medicine as directed. Make sure to finish it even if you start to feel better. SEEK MEDICAL CARE IF:   Your pain is not controlled with medicine or is increasing.  You have an increase in pus-like (purulent) secretions brought up with coughing. SEEK IMMEDIATE MEDICAL CARE IF:   You have blue or dark lips, fingernails, or toenails.  You are coughing up blood.  You have increased difficulty breathing.  You have continuing pain unrelieved by medicine or pain lasting more than 1 week.  You have pain that radiates into your neck, arms, or jaw.  You develop increased shortness of breath or wheezing.  You develop a fever, rash, vomiting, fainting, or other serious symptoms. MAKE SURE YOU:  Understand these instructions.   Will watch your condition.   Will get help right away if you are not doing well or get worse.  Document Released: 01/16/2005 Document Revised: 09/18/2012 Document Reviewed: 06/30/2012 [facility] Patient Information 2015 [employer], [place]. This information is not intended to replace advice given to you by your health care provider. Make sure you discuss any questions you have with your health care provider.  Emergency Department  Resource Guide 1) Find a Doctor and Pay Out of Pocket Although you won't have to find out who is covered by your insurance plan, it is a good idea to ask around and get recommendations. You will then need to call the office and see if the doctor you have chosen will accept you as a new patient and what types of options they offer for patients who are self-pay. Some doctors offer discounts or will set up payment plans for their patients who do not have insurance, but you will need to ask so you aren't surprised when you get to your appointment.  2) Contact Your Local Health Department Not all health departments have doctors that can see patients for sick visits, but many do, so it is worth a call to see if yours does. If you don't know where your local health department is, you can check in your phone book. The CDC also has a tool to help you locate your state's health department, and many state websites also have listings of all of their local health departments.  3) Find a Walk-in Clinic If your illness is not likely to be very severe or complicated, you may want to try a walk in clinic. These are popping up all over the country in pharmacies, drugstores, and shopping centers. They're usually staffed by nurse practitioners or physician assistants that have been trained to treat common illnesses and complaints. They're usually fairly quick and inexpensive. However, if you have serious medical issues or chronic medical problems, these are probably not your best option.  No Primary Care Doctor: - Call Health Connect  at  [phone] - they can help you locate a primary care doctor that  accepts your insurance, provides certain services, etc. - Physician Referral Service- [phone]  Chronic Pain Problems: Organization         Address  Phone   Notes  [facility]  [phone]) [phone] Patients need to be referred by their primary care doctor.   Medication Assistance: Organization          Address  Phone   Notes  [facility] Medication [facility] [address] [place], [place] [address] [phone]) [phone] --Must be a resident of [facility] -- Must have NO insurance coverage whatsoever (no Medicaid/ Medicare, etc.) -- The pt. MUST have a primary care doctor that directs their care regularly and follows them in the community   MedAssist  [phone]   [employer]  [phone]) [phone]    Agencies that provide inexpensive medical care: Organization         Address  Phone   Notes  [facility]  [phone]   [facility]    [phone]) [phone]   [facility] [address] [place], [place] [address] [phone]) [phone]   [facility] [address], [place] [phone]   Planned Parenthood    [phone](336) [phone]   [facility]    [phone](336) [phone]   [facility] and [facility]  [address], [place] Phone:  [phone], Fax:  [phone] Hours of Operation:  9 am - 6 pm, M-F.  Also accepts Medicaid/Medicare and self-pay.  [facility]  [address], [place] Phone: [phone](336) [phone], Fax: [phone]. Hours of Operation:  8:30 am - 5:30 pm, M-F.  Also accepts Medicaid and self-pay.  [facility] [place] [address], [place] Phone: [phone]) [phone]   [facility] [address], [place], [place] [phone], Ext. [phone] Mondays & Thursdays: 7-9 AM.  First 15 patients are seen on a first come, first serve basis.    Medicaid-accepting [facility] Providers:  Organization         Address  Phone   Notes  [facility] [address], [place] [phone]) [phone] Also accepts self-pay patients.  [facility] [address], [place]  [phone]) [phone]   [facility] [address], [place] [phone]) [phone]   [facility] [address] [place] [address], [place] [phone]   Renaye RakersVeita Bland [address], [place]   [phone] Only accepts [place] Access [place] patients after they have their name applied to their card.   Self-Pay (no insurance) in [place] [facility]:  Organization         Address  Phone   Notes  Sickle Cell Patients, [facility] [address], [place] [phone]) [phone]   [facility] [address], [place] [phone]) [phone]   [facility] [place]  [address], [place] [phone]   [facility]/Dr. Osei-Bonsu  [address] [place] [address], [place] or [phone], [place] [phone] Phone number for both [place] and [place] locations is the same.  [facility] [address], [place] [phone]) [phone]   [facility] [place] [address] [place]  [address], [place] or [address] [phone]   [facility] [address] [place], [place] [phone], phone; [phone], fax Sees patients 1st and 3rd Saturday of every month.  Must not qualify for public or private insurance (i.e. Medicaid, Medicare, [place] Health Choice, Veterans' Benefits)  Household income should be no more than 200% of the poverty level The clinic cannot treat you if you are pregnant or think you are pregnant  Sexually transmitted diseases are not treated at the clinic.    Dental Care: Organization         Address  Phone  Notes  [facility] [address] [place], [place] [phone] Accepts children up to age 85 who are enrolled in [place] or [place] Health Choice; pregnant women with a Medicaid card; and children who have applied for Medicaid or [place] Health Choice, but were declined, whose parents can pay a reduced fee at time of service.  [facility]  [address], [place] [phone] Accepts children up to age 49 who are enrolled in [place] or [place] Health Choice; pregnant women with a Medicaid card; and children who have applied for Medicaid or [place] Health Choice, but were declined, whose parents can pay a reduced fee at time of service.  [facility]  [address] [place], [place] [phone] Patients are seen by appointment only. Walk-ins are not accepted. [facility] will see patients 50 years of age and older. Monday - Tuesday (8am-5pm) Most Wednesdays (8:30-5pm) $30 per visit, cash only  [facility] Adult Dental Access PROGRAM  [address], [facility] [phone] Patients are seen by appointment only. Walk-ins are not accepted. [facility] will see patients 16 years of age and older. One Wednesday Evening (Monthly: Volunteer Based).  $30 per visit, cash only  [employer]  [phone] for adults; Children under age 18, call Graduate Pediatric Dentistry at [phone]. Children aged 60-14, please call [phone] to request a pediatric application.  Dental services are provided in all areas of dental care including fillings, crowns and bridges, complete and partial dentures, implants, gum treatment, root canals, and extractions. Preventive care is also provided. Treatment is provided to both adults and children. Patients are selected via a lottery and there is often a waiting list.   [facility] [address] [place]  [phone] [email or web address]   [facility] [address] [place], [place] [phone], Ext. [phone] Second and Fourth Thursday of each month, opens at 6:30 AM; Clinic ends at 9 AM.  Patients are seen on a first-come first-served basis, and a limited number are seen during each clinic.   [facility]  [address] [place], [place] [phone]   Eligibility Requirements You must  have lived in [place], [place], or [place] counties for at least the last three months.   You cannot be eligible for state or federal sponsored [place], including [employer], [place], or [employer].   You generally cannot be eligible for healthcare insurance through your employer.    How to apply: Eligibility screenings are held every Tuesday and Wednesday afternoon from 1:00 pm until 4:00 pm. You do not need an appointment for the interview!  [facility] [address] [place], [place] [phone]   [facility] Department  [phone]  [place] Department  [place]  [phone]    Behavioral Health Resources in the Community: Intensive Outpatient Programs Organization         Address  Phone  Notes  [place]. [address] [place], [place] [phone]   [facility] [address], [place], [place]   ADS: Alcohol & Drug S[facility]cs [address], [place], [place]   [place] [address]  [place], [place] or [phone]   Substance Abuse Resources Organization         Address  Phone  Notes  Alcohol and Drug Services  [phone]   [place]  [phone]   The [place]   Chinita Pester  [phone]   Residential & Outpatient Substance Abuse Program  [phone]   Psychological Services Organization         Address  Phone  Notes  [facility] [place]  [place]  [phone]   [place] [address] [place] or [phone]    [place] Crisis Teams Organization         Address  Phone  Notes  [facility], [place] Crisis Care Unit  [phone]   [facility] Psychotherapeutic Services  [address] [place], [place]   [place] Levels [address] [place] [phone]    Self-Help/Support Groups Organization         Address  Phone             Notes  [place] of [place] - variety of support groups  [place] Call for more information  Narcotics Anonymous (NA), [facility] [address], [employer] [place]  2 meetings at this location   Special educational needs teacher         Address  Phone  Notes  ASAP Residential Treatment [place],    [place]  [phone]   [facility]  [address], [place] [address], [place], [place]   [facility], [place] [phone] Admissions: 8am-3pm M-F  Incentives Substance [place] [address]    [place], [place] [phone]   [facility] [address] [place], [place], [place]   [facility] [address]  [place], [place]   Insight Programs - Intensive Outpatient [address], [place], [place]   [facility] ([place].,  [place], [place] [phone] or [phone]   [facility] Services (RTS) [address], [place], [place] Accepts Medicaid  [facility] [place] [address],  [place] [phone] Substance Abuse/Addiction Treatment   [facility] Organization         Address  Phone  Notes  [facility]  [phone]   Domenic Schwab, PhD [address] Arlis Porta [place], [place]   [phone] or [phone]   Cameron Natrona [place] [place], [place] [phone]   [place] [address] [place], [place] [phone] Insurance/Medicaid/sponsorship through [employer] and Families [address]  [place], [place] [phone] [address] [place], [place] [phone]    Dr. Adele Schilder  [phone]   [facility] Dept. 1) [address] [place] 2) [address] [place] 3)  [place] [address], [place] [phone]  [phone]   [place] [phone] or [phone] (After Hours)

## 2014-04-01 ENCOUNTER — Ambulatory Visit (INDEPENDENT_AMBULATORY_CARE_PROVIDER_SITE_OTHER): Payer: Self-pay | Admitting: Physician Assistant

## 2014-04-01 ENCOUNTER — Other Ambulatory Visit: Payer: Self-pay | Admitting: Physician Assistant

## 2014-04-01 VITALS — BP 116/70 | HR 60 | Temp 97.9°F | Resp 16 | Ht 68.75 in | Wt 183.0 lb

## 2014-04-01 DIAGNOSIS — K219 Gastro-esophageal reflux disease without esophagitis: Secondary | ICD-10-CM | POA: Insufficient documentation

## 2014-04-01 DIAGNOSIS — R1011 Right upper quadrant pain: Secondary | ICD-10-CM

## 2014-04-01 DIAGNOSIS — Z8659 Personal history of other mental and behavioral disorders: Secondary | ICD-10-CM

## 2014-04-01 LAB — POCT CBC
Granulocyte percent: 67.9 %G (ref 37–80)
HCT, POC: 46.1 % (ref 43.5–53.7)
Hemoglobin: 15.1 g/dL (ref 14.1–18.1)
LYMPH, POC: 2.3 (ref 0.6–3.4)
MCH: 30.6 pg (ref 27–31.2)
MCHC: 32.9 g/dL (ref 31.8–35.4)
MCV: 93.1 fL (ref 80–97)
MID (CBC): 0.9 (ref 0–0.9)
MPV: 8.4 fL (ref 0–99.8)
PLATELET COUNT, POC: 230 10*3/uL (ref 142–424)
POC GRANULOCYTE: 6.8 (ref 2–6.9)
POC LYMPH PERCENT: 22.9 %L (ref 10–50)
POC MID %: 9.2 % (ref 0–12)
RBC: 4.95 M/uL (ref 4.69–6.13)
RDW, POC: 14.1 %
WBC: 10 10*3/uL (ref 4.6–10.2)

## 2014-04-01 LAB — POCT URINALYSIS DIPSTICK
Bilirubin, UA: NEGATIVE
Blood, UA: NEGATIVE
GLUCOSE UA: NEGATIVE
KETONES UA: NEGATIVE
Leukocytes, UA: NEGATIVE
Nitrite, UA: NEGATIVE
PROTEIN UA: NEGATIVE
Spec Grav, UA: 1.025
Urobilinogen, UA: 1
pH, UA: 6

## 2014-04-01 LAB — POCT UA - MICROSCOPIC ONLY
BACTERIA, U MICROSCOPIC: NEGATIVE
CASTS, UR, LPF, POC: NEGATIVE
Crystals, Ur, HPF, POC: NEGATIVE
Epithelial cells, urine per micros: NEGATIVE
Mucus, UA: NEGATIVE
RBC, URINE, MICROSCOPIC: NEGATIVE
WBC, UR, HPF, POC: NEGATIVE
Yeast, UA: NEGATIVE

## 2014-04-01 MED ORDER — OMEPRAZOLE 20 MG PO CPDR: 20.0000 mg | DELAYED_RELEASE_CAPSULE | Freq: Every day | ORAL | Status: AC

## 2014-04-01 NOTE — Addendum Note (Signed)
Addended by: Ofilia NeasLARK, Jaquarius Seder L on: 04/01/2014 09:28 PM   Modules accepted: Level of Service

## 2014-04-01 NOTE — Patient Instructions (Addendum)
Appointment for Abdominal Ultrasound [address]  Imaging  04/02/14 at 12:30 pm $288- if pay in full tomorrow the cost will be $115.20 Nothing to eat or drink 6 hours prior

## 2014-04-01 NOTE — Progress Notes (Signed)
04/01/2014 at 9:08 PM  Alexander Spears / DOB: 01-02-1986 / MRN: [id number]  The patient has Chest wall pain and GERD (gastroesophageal reflux disease) on his problem list.  SUBJECTIVE  Chief compalaint: Flank Pain; Back Pain; and Abdominal Pain   History of present illness: Alexander Spears is 29 y.o. well appearing male with a history of anxiety presenting for the evaluation of waxing and waning sharp and dull right upper quadrant pain which he states is moderate to severe. This started roughly 1 year ago and is stable. Associated symptoms include tenderness in the right upper quadrant, nausea after eating and often nausea without eating, sometimes  belching, bilateral lower back pain, as well as indigestion.  He denies fever, and is stooling and urnating normally for him. He has tried been to the ED for work up of this problem in the past and was told he had chest wall pain. He has never had an ultrasound of the right upper quadrant and would like to have this done, as he is convinced that something is wrong with his liver despite normal labs. His uncle passed from liver failure secondary to alcoholism.  Alexander Spears is a non drinker for the last three months, and previous testing shows normal liver studies.    He does complain of indigestion that occurs two to three times per week, but can not necessarily asoociate it with the above complaint.  He denies smoking, weight loss, melena, hematochezia, dysphagia, and odynophagia.  He symptoms are not worse with lying down at night and he can not associate his indigestion with certain foods.        He  has a past medical history of Alcoholic; Asthma; Anxiety; and Myocardial infarction.    He currently has no medications in their medication list.  Alexander Spears has No Known Allergies. He  reports that he has quit smoking. He does not have any smokeless tobacco history on file. He reports that he drinks alcohol. He reports that he does not use illicit  drugs. He  has no sexual activity history on file. The patient  has no past surgical history on file.  His family history includes Diabetes in his other.  ROS  Per HPI  OBJECTIVE  His  height is 5' 8.75" (1.746 m) and weight is 183 lb (83.008 kg). His oral temperature is 97.9 F (36.6 C). His blood pressure is 116/70 and his pulse is 60. His respiration is 16 and oxygen saturation is 99%.  The patient's body mass index is 27.23 kg/(m^2).  Physical Exam  Constitutional: He appears well-developed and well-nourished.  HENT:  Head: Normocephalic.  Eyes: Conjunctivae and EOM are normal. Pupils are equal, round, and reactive to light. No scleral icterus.  Cardiovascular: Normal rate, regular rhythm and normal heart sounds.  Exam reveals no gallop and no friction rub.   No murmur heard. Respiratory: Effort normal and breath sounds normal. He has no wheezes. He has no rales. He exhibits no tenderness.  GI: Soft. Bowel sounds are normal. He exhibits no distension and no mass. There is no hepatosplenomegaly. There is no tenderness. There is no rigidity, no rebound, no guarding, no CVA tenderness, no tenderness at McBurney's point and negative Murphy's sign. No hernia. Hernia confirmed negative in the ventral area.      Results for orders placed or performed in visit on 04/01/14 (from the past 24 hour(s))  POCT CBC     Status: None   Collection Time: 04/01/14  4:20 PM  Result Value Ref Range   WBC 10.0 4.6 - 10.2 K/uL   Lymph, poc 2.3 0.6 - 3.4   POC LYMPH PERCENT 22.9 10 - 50 %L   MID (cbc) 0.9 0 - 0.9   POC MID % 9.2 0 - 12 %M   POC Granulocyte 6.8 2 - 6.9   Granulocyte percent 67.9 37 - 80 %G   RBC 4.95 4.69 - 6.13 M/uL   Hemoglobin 15.1 14.1 - 18.1 g/dL   HCT, POC 41.346.1 24.443.5 - 53.7 %   MCV 93.1 80 - 97 fL   MCH, POC 30.6 27 - 31.2 pg   MCHC 32.9 31.8 - 35.4 g/dL   RDW, POC 01.014.1 %   Platelet Count, POC 230 142 - 424 K/uL   MPV 8.4 0 - 99.8 fL  POCT urinalysis dipstick     Status:  None   Collection Time: 04/01/14  4:29 PM  Result Value Ref Range   Color, UA yellow    Clarity, UA clear    Glucose, UA neg    Bilirubin, UA neg    Ketones, UA neg    Spec Grav, UA 1.025    Blood, UA neg    pH, UA 6.0    Protein, UA neg    Urobilinogen, UA 1.0    Nitrite, UA neg    Leukocytes, UA Negative   POCT UA - Microscopic Only     Status: None   Collection Time: 04/01/14  4:29 PM  Result Value Ref Range   WBC, Ur, HPF, POC neg    RBC, urine, microscopic neg    Bacteria, U Microscopic neg    Mucus, UA neg    Epithelial cells, urine per micros neg    Crystals, Ur, HPF, POC neg    Casts, Ur, LPF, POC neg    Yeast, UA neg     ASSESSMENT & PLAN  Alexander Spears was seen today for flank pain, back pain and abdominal pain.  Diagnoses and all orders for this visit:  Gastroesophageal reflux disease, esophagitis presence not specified: Patient suffering regularly and csn not associate foods with his symptoms. Will treat his GERD in the hopes that this also alleviates his right sided abdominal pain, as his pain could possibly be radicular in nature.   Orders: -     POCT CBC -     omeprazole (PRILOSEC) 20 MG capsule; Take 1 capsule (20 mg total) by mouth daily.  RUQ abdominal pain: Patient has been worked up in the past but could benefit from further evaluation as he has not been tested for hepatitis or other commonly acquired infectious disease that may explain his symptoms.  It is certainly possible this patient could be suffering from gallstones, given that his symptoms are sometimes worse with eating.  His exam is reassuring however, given a benign abdominal exam.  Will RUQ US scheduled for tomorrow and I will cal patient with results.   Orders: -     Comprehensive metabolic panel -     POCT urinalysis dipstick -     POCT UA - Microscopic Only -     Hepatitis B surface antibody -     Hepatitis B surface antigen -     Hepatitis C Ab Reflex HCV RNA, QUANT -     HIV antibody -      RPR -     Sedimentation Rate -     US Abdomen Limited RUQ; Future  History of panic attacks: Most likely  secondary to an undiagnosed anxiety disorder and/or possible depression.  Patient not willing to consider this as the etiology of his abdominal pain today, however, is willing to consider this should he work up remain unrevealing.      The patient was advised to call or come back to clinic if he does not see an improvement in symptoms, or worsens with the above plan.   Deliah Boston, MHS, PA-C [facility] 04/01/2014 9:08 PM

## 2014-04-02 ENCOUNTER — Ambulatory Visit
Admission: RE | Admit: 2014-04-02 | Discharge: 2014-04-02 | Disposition: A | Discharge status: Home / Self Care | Payer: No Typology Code available for payment source | Source: Ambulatory Visit | Attending: Physician Assistant | Admitting: Physician Assistant

## 2014-04-02 ENCOUNTER — Encounter: Payer: Self-pay | Admitting: Physician Assistant

## 2014-04-02 ENCOUNTER — Telehealth: Payer: Self-pay | Admitting: Physician Assistant

## 2014-04-02 DIAGNOSIS — R1011 Right upper quadrant pain: Secondary | ICD-10-CM

## 2014-04-02 LAB — COMPREHENSIVE METABOLIC PANEL
ALBUMIN: 4.5 g/dL (ref 3.5–5.2)
ALT: 30 U/L (ref 0–53)
AST: 23 U/L (ref 0–37)
Alkaline Phosphatase: 65 U/L (ref 39–117)
BUN: 18 mg/dL (ref 6–23)
CALCIUM: 9.4 mg/dL (ref 8.4–10.5)
CO2: 25 mEq/L (ref 19–32)
Chloride: 101 mEq/L (ref 96–112)
Creat: 0.9 mg/dL (ref 0.50–1.35)
Glucose, Bld: 80 mg/dL (ref 70–99)
Potassium: 4.4 mEq/L (ref 3.5–5.3)
SODIUM: 136 meq/L (ref 135–145)
Total Bilirubin: 0.4 mg/dL (ref 0.2–1.2)
Total Protein: 7.5 g/dL (ref 6.0–8.3)

## 2014-04-02 LAB — HEPATITIS C ANTIBODY: HCV Ab: NEGATIVE

## 2014-04-02 LAB — HEPATITIS B SURFACE ANTIGEN: Hepatitis B Surface Ag: NEGATIVE

## 2014-04-02 LAB — HIV ANTIBODY (ROUTINE TESTING W REFLEX): HIV: NONREACTIVE

## 2014-04-02 LAB — RPR

## 2014-04-02 LAB — SEDIMENTATION RATE: SED RATE: 6 mm/h (ref 0–15)

## 2014-04-02 LAB — HEPATITIS B SURFACE ANTIBODY, QUANTITATIVE: Hepatitis B-Post: 469 m[IU]/mL

## 2014-04-02 NOTE — Telephone Encounter (Signed)
Spoke with patient regarding negative labs and negative RUQ ultrasound. Patient to continue omeprazole daily for now and will call or RTC in roughly two weeks regarding his symptoms.  Deliah BostonMichael Clark, MS, PA-C   10:40 PM, 04/02/2014

## 2015-03-26 ENCOUNTER — Emergency Department (HOSPITAL_COMMUNITY)
Admission: EM | Admit: 2015-03-26 | Discharge: 2015-03-26 | Disposition: A | Discharge status: Home / Self Care | Payer: No Typology Code available for payment source | Attending: Emergency Medicine | Admitting: Emergency Medicine

## 2015-03-26 ENCOUNTER — Encounter (HOSPITAL_COMMUNITY): Payer: Self-pay | Admitting: Emergency Medicine

## 2015-03-26 DIAGNOSIS — R112 Nausea with vomiting, unspecified: Secondary | ICD-10-CM | POA: Insufficient documentation

## 2015-03-26 DIAGNOSIS — R079 Chest pain, unspecified: Secondary | ICD-10-CM | POA: Insufficient documentation

## 2015-03-26 DIAGNOSIS — M79602 Pain in left arm: Secondary | ICD-10-CM | POA: Insufficient documentation

## 2015-03-26 DIAGNOSIS — J45909 Unspecified asthma, uncomplicated: Secondary | ICD-10-CM | POA: Insufficient documentation

## 2015-03-26 LAB — CBC WITH DIFFERENTIAL/PLATELET
BASOS PCT: 0 %
Basophils Absolute: 0 10*3/uL (ref 0.0–0.1)
EOS ABS: 0 10*3/uL (ref 0.0–0.7)
Eosinophils Relative: 0 %
HCT: 45.4 % (ref 39.0–52.0)
Hemoglobin: 15.2 g/dL (ref 13.0–17.0)
Lymphocytes Relative: 23 %
Lymphs Abs: 2.9 10*3/uL (ref 0.7–4.0)
MCH: 30.8 pg (ref 26.0–34.0)
MCHC: 33.5 g/dL (ref 30.0–36.0)
MCV: 91.9 fL (ref 78.0–100.0)
MONO ABS: 1.2 10*3/uL — AB (ref 0.1–1.0)
MONOS PCT: 9 %
NEUTROS ABS: 8.3 10*3/uL — AB (ref 1.7–7.7)
NEUTROS PCT: 68 %
Platelets: 288 10*3/uL (ref 150–400)
RBC: 4.94 MIL/uL (ref 4.22–5.81)
RDW: 14.1 % (ref 11.5–15.5)
WBC: 12.3 10*3/uL — ABNORMAL HIGH (ref 4.0–10.5)

## 2015-03-26 LAB — COMPREHENSIVE METABOLIC PANEL
ALBUMIN: 4.7 g/dL (ref 3.5–5.0)
ALT: 26 U/L (ref 17–63)
ANION GAP: 16 — AB (ref 5–15)
AST: 30 U/L (ref 15–41)
Alkaline Phosphatase: 74 U/L (ref 38–126)
BUN: 8 mg/dL (ref 6–20)
CHLORIDE: 104 mmol/L (ref 101–111)
CO2: 22 mmol/L (ref 22–32)
Calcium: 9.3 mg/dL (ref 8.9–10.3)
Creatinine, Ser: 1.03 mg/dL (ref 0.61–1.24)
GFR calc non Af Amer: >60 mL/min (ref >60)
GLUCOSE: 103 mg/dL — AB (ref 65–99)
POTASSIUM: 3.7 mmol/L (ref 3.5–5.1)
SODIUM: 142 mmol/L (ref 135–145)
Total Bilirubin: 0.5 mg/dL (ref 0.3–1.2)
Total Protein: 8.7 g/dL — ABNORMAL HIGH (ref 6.5–8.1)

## 2015-03-26 LAB — LIPASE, BLOOD: LIPASE: 21 U/L (ref 11–51)

## 2015-03-26 MED ORDER — ONDANSETRON 4 MG PO TBDP
ORAL_TABLET | ORAL | Status: DC
Start: 2015-03-26 — End: 2015-03-26
  Filled 2015-03-26: qty 2

## 2015-03-26 MED ORDER — ONDANSETRON 4 MG PO TBDP
8.0000 mg | ORAL_TABLET | Freq: Once | ORAL | Status: AC
  Administered 2015-03-26: 8 mg via ORAL

## 2015-03-26 NOTE — ED Notes (Signed)
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

## 2015-03-26 NOTE — ED Notes (Signed)
Pt called repeatedly in waiting room earlier by x-ray with no response- then by ER staff.

## 2016-10-13 ENCOUNTER — Emergency Department (HOSPITAL_COMMUNITY)
Admission: EM | Admit: 2016-10-13 | Discharge: 2016-10-13 | Disposition: A | Discharge status: Home / Self Care | Payer: Self-pay | Attending: Emergency Medicine | Admitting: Emergency Medicine

## 2016-10-13 ENCOUNTER — Emergency Department (HOSPITAL_COMMUNITY): Payer: Self-pay

## 2016-10-13 ENCOUNTER — Encounter (HOSPITAL_COMMUNITY): Payer: Self-pay | Admitting: Emergency Medicine

## 2016-10-13 DIAGNOSIS — J45909 Unspecified asthma, uncomplicated: Secondary | ICD-10-CM | POA: Insufficient documentation

## 2016-10-13 DIAGNOSIS — Z87891 Personal history of nicotine dependence: Secondary | ICD-10-CM | POA: Insufficient documentation

## 2016-10-13 DIAGNOSIS — K529 Noninfective gastroenteritis and colitis, unspecified: Secondary | ICD-10-CM | POA: Insufficient documentation

## 2016-10-13 LAB — COMPREHENSIVE METABOLIC PANEL
ALK PHOS: 83 U/L (ref 38–126)
ALT: 32 U/L (ref 17–63)
ANION GAP: 7 (ref 5–15)
AST: 27 U/L (ref 15–41)
Albumin: 3.6 g/dL (ref 3.5–5.0)
BUN: 7 mg/dL (ref 6–20)
CALCIUM: 8.9 mg/dL (ref 8.9–10.3)
CHLORIDE: 101 mmol/L (ref 101–111)
CO2: 27 mmol/L (ref 22–32)
CREATININE: 1.05 mg/dL (ref 0.61–1.24)
Glucose, Bld: 129 mg/dL — ABNORMAL HIGH (ref 65–99)
Potassium: 3.4 mmol/L — ABNORMAL LOW (ref 3.5–5.1)
Sodium: 135 mmol/L (ref 135–145)
Total Bilirubin: 0.6 mg/dL (ref 0.3–1.2)
Total Protein: 7.6 g/dL (ref 6.5–8.1)

## 2016-10-13 LAB — LIPASE, BLOOD: LIPASE: 28 U/L (ref 11–51)

## 2016-10-13 LAB — URINALYSIS, ROUTINE W REFLEX MICROSCOPIC
BILIRUBIN URINE: NEGATIVE
Bacteria, UA: NONE SEEN
GLUCOSE, UA: NEGATIVE mg/dL
KETONES UR: NEGATIVE mg/dL
LEUKOCYTES UA: NEGATIVE
Nitrite: NEGATIVE
PROTEIN: NEGATIVE mg/dL
Specific Gravity, Urine: 1.01 (ref 1.005–1.030)
Squamous Epithelial / LPF: NONE SEEN
pH: 6 (ref 5.0–8.0)

## 2016-10-13 LAB — CBC
HCT: 42.7 % (ref 39.0–52.0)
Hemoglobin: 13.8 g/dL (ref 13.0–17.0)
MCH: 29.7 pg (ref 26.0–34.0)
MCHC: 32.3 g/dL (ref 30.0–36.0)
MCV: 91.8 fL (ref 78.0–100.0)
PLATELETS: 238 10*3/uL (ref 150–400)
RBC: 4.65 MIL/uL (ref 4.22–5.81)
RDW: 13.8 % (ref 11.5–15.5)
WBC: 12.5 10*3/uL — AB (ref 4.0–10.5)

## 2016-10-13 MED ORDER — HYDROCODONE-ACETAMINOPHEN 5-325 MG PO TABS: ORAL_TABLET | ORAL | 0 refills | Status: AC

## 2016-10-13 MED ORDER — PROMETHAZINE HCL 25 MG PO TABS: 25.0000 mg | ORAL_TABLET | Freq: Four times a day (QID) | ORAL | 0 refills | Status: AC | PRN

## 2016-10-13 MED ORDER — CIPROFLOXACIN HCL 500 MG PO TABS
500.0000 mg | ORAL_TABLET | Freq: Once | ORAL | Status: AC
  Administered 2016-10-13: 500 mg via ORAL
  Filled 2016-10-13: qty 1

## 2016-10-13 MED ORDER — IOPAMIDOL (ISOVUE-300) INJECTION 61%
INTRAVENOUS | Status: AC
  Administered 2016-10-13: 100 mL
  Filled 2016-10-13: qty 100

## 2016-10-13 MED ORDER — ONDANSETRON HCL 4 MG/2ML IJ SOLN
4.0000 mg | Freq: Once | INTRAMUSCULAR | Status: AC
  Administered 2016-10-13: 4 mg via INTRAVENOUS
  Filled 2016-10-13: qty 2

## 2016-10-13 MED ORDER — METRONIDAZOLE 500 MG PO TABS: 500.0000 mg | ORAL_TABLET | Freq: Three times a day (TID) | ORAL | 0 refills | Status: AC

## 2016-10-13 MED ORDER — HYDROMORPHONE HCL 1 MG/ML IJ SOLN
0.5000 mg | Freq: Once | INTRAMUSCULAR | Status: AC
  Administered 2016-10-13: 0.5 mg via INTRAVENOUS
  Filled 2016-10-13: qty 1

## 2016-10-13 MED ORDER — METRONIDAZOLE 500 MG PO TABS
500.0000 mg | ORAL_TABLET | Freq: Once | ORAL | Status: AC
  Administered 2016-10-13: 500 mg via ORAL
  Filled 2016-10-13: qty 1

## 2016-10-13 MED ORDER — SODIUM CHLORIDE 0.9 % IV BOLUS (SEPSIS)
1000.0000 mL | Freq: Once | INTRAVENOUS | Status: AC
  Administered 2016-10-13: 1000 mL via INTRAVENOUS

## 2016-10-13 MED ORDER — CIPROFLOXACIN HCL 500 MG PO TABS: 500.0000 mg | ORAL_TABLET | Freq: Two times a day (BID) | ORAL | 0 refills | Status: AC

## 2016-10-13 NOTE — Discharge Instructions (Signed)
Take vicodin for breakthrough pain, do not drink alcohol, drive, care for children or do other critical tasks while taking vicodin.  Do not drink alcohol while you are taking flagyl (metronidazole) because it will make you very sick.  Do not hesitate to return to the emergency room for any new, worsening or concerning symptoms.  Please obtain primary care using resource guide below. Let them know that you were seen in the emergency room and that they will need to obtain records for further outpatient management.

## 2016-10-13 NOTE — ED Triage Notes (Addendum)
Pt states 4 days of RLQ abdominal pain radiating to the front with diarrhea and fever, has not checked temperature. Pt also states right lower back pain. Denies urinary symptoms. Hx of ETOH. Had not drank alcohol in a while but had a few beers the day before this pain started.

## 2016-10-13 NOTE — ED Provider Notes (Signed)
[facility]-EMERGENCY DEPT Provider Note   CSN: [id number] Arrival date & time: 10/13/16  1312     History   Chief Complaint Chief Complaint  Patient presents with  . Abdominal Pain    HPI  Blood pressure 125/88, pulse 79, temperature 98.7 F (37.1 C), temperature source Oral, resp. rate 18, SpO2 98 %.  Alexander Spears is a 31 y.o. male with past medical history significant for alcohol abuse, MI complaining of right flank abdominal pain onset 4 days ago radiating around to the right lower quadrant over the last several days, he rates his pain at 9 out of 10, he states is positional. He had associated nonbloody, non-melanotic diarrhea. He reports 2 episodes of tactile fevers with profuse sweating he started vomiting today. He reports reduced by mouth intake no hematuria, dysuria, history of kidney stones. No pain medication taken prior to arrival, he has not ate or drank today. He states he started feeling better yesterday and started to eat a sandwich and felt immediately ill. Patient denies any testicular pain or swelling  Past Medical History:  Diagnosis Date  . Alcoholic (HCC)    pt denies  . Anxiety   . Asthma    Has not had attack since childhood  . Myocardial infarction [facility])     Patient Active Problem List   Diagnosis Date Noted  . GERD (gastroesophageal reflux disease) 04/01/2014  . Chest wall pain 10/23/2012    No past surgical history on file.     Home Medications    Prior to Admission medications   Medication Sig Start Date End Date Taking? Authorizing Provider  ibuprofen (ADVIL,MOTRIN) 200 MG tablet Take 200 mg by mouth every 6 (six) hours as needed for moderate pain.   Yes [provider]  Phenylephrine-Pheniramine-DM [facility] COLD & COUGH) 11-19-18 MG PACK Take 1 packet by mouth every 8 (eight) hours as needed (pain).   Yes [provider]  ciprofloxacin (CIPRO) 500 MG tablet Take 1 tablet (500 mg total) by mouth every 12 (twelve) hours.  10/13/16 10/18/16  Kelan Pritt, Joni Reining, PA-C  HYDROcodone-acetaminophen (NORCO/VICODIN) 5-325 MG tablet Take 1-2 tablets by mouth every 6 hours as needed for pain. 10/13/16   Hayde Kilgour, Joni Reining, PA-C  metroNIDAZOLE (FLAGYL) 500 MG tablet Take 1 tablet (500 mg total) by mouth 3 (three) times daily. 10/13/16 10/18/16  Tykel Badie, Joni Reining, PA-C  omeprazole (PRILOSEC) 20 MG capsule Take 1 capsule (20 mg total) by mouth daily. Patient not taking: Reported on 10/13/2016 04/01/14   Ofilia Neas, PA-C  promethazine (PHENERGAN) 25 MG tablet Take 1 tablet (25 mg total) by mouth every 6 (six) hours as needed for nausea or vomiting. 10/13/16   Darrio Bade, Joni Reining, PA-C    Family History Family History  Problem Relation Age of Onset  . Diabetes Other     Social History Social History  Substance Use Topics  . Smoking status: Former Games developer  . Smokeless tobacco: Not on file  . Alcohol use Yes     Comment: social     Allergies   Patient has no known allergies.   Review of Systems Review of Systems  A complete review of systems was obtained and all systems are negative except as noted in the HPI and PMH.   Physical Exam Updated Vital Signs BP (!) 98/53   Pulse 85   Temp 98.7 F (37.1 C) (Oral)   Resp 16   SpO2 98%   Physical Exam  Constitutional: He is oriented to person, place, and time. He  appears well-developed and well-nourished. No distress.  HENT:  Head: Normocephalic and atraumatic.  Right Ear: External ear normal.  Mouth/Throat: Oropharynx is clear and moist.  Eyes: Pupils are equal, round, and reactive to light. Conjunctivae and EOM are normal.  Neck: Normal range of motion.  Cardiovascular: Normal rate, regular rhythm and intact distal pulses.   Pulmonary/Chest: Effort normal and breath sounds normal.  Abdominal: Soft. There is no tenderness.  No CVA tenderness to percussion bilaterally, tenderness palpation along McBurney's point, obturator and psoas are negative. Rovsing negative.    Musculoskeletal: Normal range of motion.  Neurological: He is alert and oriented to person, place, and time.  Skin: He is not diaphoretic.  Psychiatric: He has a normal mood and affect.  Nursing note and vitals reviewed.    ED Treatments / Results  Labs (all labs ordered are listed, but only abnormal results are displayed) Labs Reviewed  COMPREHENSIVE METABOLIC PANEL - Abnormal; Notable for the following:       Result Value   Potassium 3.4 (*)    Glucose, Bld 129 (*)    All other components within normal limits  CBC - Abnormal; Notable for the following:    WBC 12.5 (*)    All other components within normal limits  URINALYSIS, ROUTINE W REFLEX MICROSCOPIC - Abnormal; Notable for the following:    Hgb urine dipstick SMALL (*)    All other components within normal limits  LIPASE, BLOOD  ETHANOL    EKG  EKG Interpretation None       Radiology Ct Abdomen Pelvis W Contrast  Result Date: 10/13/2016 CLINICAL DATA:  Abdominal pain, diarrhea and fever. EXAM: CT ABDOMEN AND PELVIS WITH CONTRAST TECHNIQUE: Multidetector CT imaging of the abdomen and pelvis was performed using the standard protocol following bolus administration of intravenous contrast. CONTRAST:  ISOVUE-300 IOPAMIDOL (ISOVUE-300) INJECTION 61% COMPARISON:  None. FINDINGS: Lower chest: No acute abnormality. Hepatobiliary: No focal liver abnormality is seen. No gallstones, gallbladder wall thickening, or biliary dilatation. Pancreas: Unremarkable. No pancreatic ductal dilatation or surrounding inflammatory changes. Spleen: Normal in size without focal abnormality. Adrenals/Urinary Tract: Adrenal glands are unremarkable. Kidneys are normal, without renal calculi, focal lesion, or hydronephrosis. Bladder is unremarkable. Stomach/Bowel: No evidence of bowel obstruction. There is some wall thickening in the ascending colon extending up through the hepatic flexure into the transverse colon. Findings are consistent with  colitis. There may also be some mild colitis involving the rest of the colon. No evidence of free air or focal abscess. The appendix is normal. Vascular/Lymphatic: No significant vascular findings are present. No enlarged abdominal or pelvic lymph nodes. Reproductive: Status post hysterectomy. No adnexal masses. Other: No abdominal wall hernia or abnormality. No abdominopelvic ascites. Musculoskeletal: No acute or significant osseous findings. IMPRESSION: Evidence by CT of colitis primarily involving the proximal half of the colon but also likely involving some of the rest of the colon. No associated bowel obstruction, free air or abscess. Electronically Signed   By: Irish Lack M.D.   On: 10/13/2016 17:53    Procedures Procedures (including critical care time)  Medications Ordered in ED Medications  ciprofloxacin (CIPRO) tablet 500 mg (not administered)  metroNIDAZOLE (FLAGYL) tablet 500 mg (not administered)  ondansetron (ZOFRAN) injection 4 mg (4 mg Intravenous Given 10/13/16 1611)  HYDROmorphone (DILAUDID) injection 0.5 mg (0.5 mg Intravenous Given 10/13/16 1611)  iopamidol (ISOVUE-300) 61 % injection (100 mLs  Contrast Given 10/13/16 1630)  sodium chloride 0.9 % bolus 1,000 mL (0 mLs Intravenous Stopped  10/13/16 1829)     Initial Impression / Assessment and Plan / ED Course  I have reviewed the triage vital signs and the nursing notes.  Pertinent labs & imaging results that were available during my care of the patient were reviewed by me and considered in my medical decision making (see chart for details).     Vitals:   10/13/16 1500 10/13/16 1600 10/13/16 1723 10/13/16 1745  BP: 125/88 112/67 111/75 (!) 98/53  Pulse: 79 78 86 85  Resp: Temp:      TempSrc:      SpO2: 98% 99% 98% 98%    Medications  ciprofloxacin (CIPRO) tablet 500 mg (not administered)  metroNIDAZOLE (FLAGYL) tablet 500 mg (not administered)  ondansetron (ZOFRAN) injection 4 mg (4 mg Intravenous  Given 10/13/16 1611)  HYDROmorphone (DILAUDID) injection 0.5 mg (0.5 mg Intravenous Given 10/13/16 1611)  iopamidol (ISOVUE-300) 61 % injection (100 mLs  Contrast Given 10/13/16 1630)  sodium chloride 0.9 % bolus 1,000 mL (0 mLs Intravenous Stopped 10/13/16 1829)    Alexander Spears is 31 y.o. male presenting with Right flank pain radiating around to the right groin with associated anorexia, diarrhea and tactile fever worsening over the course of 4 days. No significant peritoneal signs and patient states that the pain is positional however given his reported fever and leukocytosis with tenderness in the right lower quadrant will obtain CT to evaluate for appendicitis versus stone.  This patient has a history of alcohol abuse in his chart however he denies it to me today. Given the patient may be going for emergent surgery will obtain EtOH level. Advised him to remain nothing by mouth.  CAT scan with colitis, will treat with Cipro and Flagyl, counseled patient on return precautions and also given gastroenterology referral if symptoms persist. Advised him that he will become very sick if he combines the Flagyl with alcohol and he states that he does not drink at all now.  Evaluation does not show pathology that would require ongoing emergent intervention or inpatient treatment. Pt is hemodynamically stable and mentating appropriately. Discussed findings and plan with patient/guardian, who agrees with care plan. All questions answered. Return precautions discussed and outpatient follow up given.    Final Clinical Impressions(s) / ED Diagnoses   Final diagnoses:  Colitis    New Prescriptions New Prescriptions   CIPROFLOXACIN (CIPRO) 500 MG TABLET    Take 1 tablet (500 mg total) by mouth every 12 (twelve) hours.   HYDROCODONE-ACETAMINOPHEN (NORCO/VICODIN) 5-325 MG TABLET    Take 1-2 tablets by mouth every 6 hours as needed for pain.   METRONIDAZOLE (FLAGYL) 500 MG TABLET    Take 1 tablet (500 mg  total) by mouth 3 (three) times daily.   PROMETHAZINE (PHENERGAN) 25 MG TABLET    Take 1 tablet (25 mg total) by mouth every 6 (six) hours as needed for nausea or vomiting.     Wynetta Emery, Cordelia Poche 10/13/16 1830    Tegeler, Canary Brim, MD 10/14/16 [phone]

## 2020-05-24 ENCOUNTER — Emergency Department (HOSPITAL_BASED_OUTPATIENT_CLINIC_OR_DEPARTMENT_OTHER): Payer: No Typology Code available for payment source

## 2020-05-24 ENCOUNTER — Emergency Department (HOSPITAL_BASED_OUTPATIENT_CLINIC_OR_DEPARTMENT_OTHER)
Admission: EM | Admit: 2020-05-24 | Discharge: 2020-05-24 | Disposition: A | Discharge status: Home / Self Care | Payer: No Typology Code available for payment source | Attending: Emergency Medicine | Admitting: Emergency Medicine

## 2020-05-24 ENCOUNTER — Emergency Department (HOSPITAL_BASED_OUTPATIENT_CLINIC_OR_DEPARTMENT_OTHER): Payer: No Typology Code available for payment source | Admitting: Radiology

## 2020-05-24 ENCOUNTER — Other Ambulatory Visit: Payer: Self-pay

## 2020-05-24 ENCOUNTER — Encounter (HOSPITAL_BASED_OUTPATIENT_CLINIC_OR_DEPARTMENT_OTHER): Payer: Self-pay | Admitting: Emergency Medicine

## 2020-05-24 DIAGNOSIS — J45909 Unspecified asthma, uncomplicated: Secondary | ICD-10-CM | POA: Insufficient documentation

## 2020-05-24 DIAGNOSIS — T07XXXA Unspecified multiple injuries, initial encounter: Secondary | ICD-10-CM

## 2020-05-24 DIAGNOSIS — M25561 Pain in right knee: Secondary | ICD-10-CM | POA: Insufficient documentation

## 2020-05-24 DIAGNOSIS — Z87891 Personal history of nicotine dependence: Secondary | ICD-10-CM | POA: Insufficient documentation

## 2020-05-24 DIAGNOSIS — Y92410 Unspecified street and highway as the place of occurrence of the external cause: Secondary | ICD-10-CM | POA: Insufficient documentation

## 2020-05-24 DIAGNOSIS — S301XXA Contusion of abdominal wall, initial encounter: Secondary | ICD-10-CM | POA: Insufficient documentation

## 2020-05-24 DIAGNOSIS — S8001XA Contusion of right knee, initial encounter: Secondary | ICD-10-CM

## 2020-05-24 DIAGNOSIS — Z23 Encounter for immunization: Secondary | ICD-10-CM | POA: Insufficient documentation

## 2020-05-24 DIAGNOSIS — S80811A Abrasion, right lower leg, initial encounter: Secondary | ICD-10-CM | POA: Insufficient documentation

## 2020-05-24 DIAGNOSIS — S20212A Contusion of left front wall of thorax, initial encounter: Secondary | ICD-10-CM

## 2020-05-24 DIAGNOSIS — M542 Cervicalgia: Secondary | ICD-10-CM | POA: Insufficient documentation

## 2020-05-24 DIAGNOSIS — R0789 Other chest pain: Secondary | ICD-10-CM | POA: Insufficient documentation

## 2020-05-24 DIAGNOSIS — S80812A Abrasion, left lower leg, initial encounter: Secondary | ICD-10-CM | POA: Insufficient documentation

## 2020-05-24 DIAGNOSIS — S39012A Strain of muscle, fascia and tendon of lower back, initial encounter: Secondary | ICD-10-CM

## 2020-05-24 DIAGNOSIS — M546 Pain in thoracic spine: Secondary | ICD-10-CM | POA: Insufficient documentation

## 2020-05-24 DIAGNOSIS — M545 Low back pain, unspecified: Secondary | ICD-10-CM | POA: Insufficient documentation

## 2020-05-24 DIAGNOSIS — S161XXA Strain of muscle, fascia and tendon at neck level, initial encounter: Secondary | ICD-10-CM

## 2020-05-24 MED ORDER — TETANUS-DIPHTH-ACELL PERTUSSIS 5-2.5-18.5 LF-MCG/0.5 IM SUSY
0.5000 mL | PREFILLED_SYRINGE | Freq: Once | INTRAMUSCULAR | Status: AC
  Administered 2020-05-24: 0.5 mL via INTRAMUSCULAR
  Filled 2020-05-24: qty 0.5

## 2020-05-24 MED ORDER — METHOCARBAMOL 750 MG PO TABS: 750.0000 mg | ORAL_TABLET | Freq: Three times a day (TID) | ORAL | 0 refills | Status: AC | PRN

## 2020-05-24 MED ORDER — ACETAMINOPHEN 500 MG PO TABS
1000.0000 mg | ORAL_TABLET | Freq: Once | ORAL | Status: AC
  Administered 2020-05-24: 1000 mg via ORAL
  Filled 2020-05-24: qty 2
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
## 2020-05-24 NOTE — Discharge Instructions (Signed)
It was our pleasure to provide your ER care today - we hope that you feel better.  Take acetaminophen as need. You may also take robaxin as need for muscle pain/spasm - no driving when taking.  Follow up with primary care doctor in 1-2 weeks if symptoms fail to improve/resolve.  Return to ER if worse, new symptoms, severe pain, headache, abdominal pain, or other concern.
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
